# Patient Record
Sex: Male | Born: 1950
Health system: Southern US, Community
[De-identification: ages and names within clinical notes are randomized; demographics above are authoritative.]

## PROBLEM LIST (undated history)

## (undated) DIAGNOSIS — E785 Hyperlipidemia, unspecified: Secondary | ICD-10-CM

## (undated) DIAGNOSIS — I219 Acute myocardial infarction, unspecified: Secondary | ICD-10-CM

## (undated) DIAGNOSIS — M1990 Unspecified osteoarthritis, unspecified site: Secondary | ICD-10-CM

## (undated) DIAGNOSIS — E039 Hypothyroidism, unspecified: Secondary | ICD-10-CM

## (undated) DIAGNOSIS — I10 Essential (primary) hypertension: Secondary | ICD-10-CM

## (undated) DIAGNOSIS — Z9289 Personal history of other medical treatment: Secondary | ICD-10-CM

## (undated) DIAGNOSIS — I2510 Atherosclerotic heart disease of native coronary artery without angina pectoris: Secondary | ICD-10-CM

## (undated) DIAGNOSIS — C61 Malignant neoplasm of prostate: Secondary | ICD-10-CM

## (undated) DIAGNOSIS — R011 Cardiac murmur, unspecified: Secondary | ICD-10-CM

## (undated) DIAGNOSIS — I350 Nonrheumatic aortic (valve) stenosis: Secondary | ICD-10-CM

## (undated) HISTORY — DX: Hypothyroidism, unspecified: E03.9

## (undated) HISTORY — DX: Nonrheumatic aortic (valve) stenosis: I35.0

## (undated) HISTORY — DX: Personal history of other medical treatment: Z92.89

## (undated) HISTORY — DX: Hyperlipidemia, unspecified: E78.5

## (undated) HISTORY — DX: Atherosclerotic heart disease of native coronary artery without angina pectoris: I25.10

## (undated) HISTORY — DX: Essential (primary) hypertension: I10

## (undated) HISTORY — DX: Malignant neoplasm of prostate: C61

---

## 2008-10-13 HISTORY — PX: HERNIA REPAIR: SHX51

## 2009-10-13 DIAGNOSIS — I219 Acute myocardial infarction, unspecified: Secondary | ICD-10-CM

## 2009-10-13 DIAGNOSIS — I2510 Atherosclerotic heart disease of native coronary artery without angina pectoris: Secondary | ICD-10-CM | POA: Insufficient documentation

## 2009-10-13 HISTORY — PX: CARDIAC CATHETERIZATION: SHX172

## 2009-10-13 HISTORY — DX: Atherosclerotic heart disease of native coronary artery without angina pectoris: I25.10

## 2009-10-13 HISTORY — DX: Acute myocardial infarction, unspecified: I21.9

## 2011-10-14 HISTORY — PX: JOINT REPLACEMENT: SHX530

## 2011-10-14 HISTORY — PX: TOTAL HIP ARTHROPLASTY: SHX124

## 2013-10-13 HISTORY — PX: LUMBAR LAMINECTOMY: SHX95

## 2017-04-12 HISTORY — PX: PROSTATE BIOPSY: SHX241

## 2017-05-22 ENCOUNTER — Encounter: Payer: Self-pay | Admitting: Radiation Oncology

## 2017-05-22 ENCOUNTER — Encounter: Payer: Self-pay | Admitting: *Deleted

## 2017-06-01 ENCOUNTER — Ambulatory Visit
Admission: RE | Admit: 2017-06-01 | Discharge: 2017-06-01 | Disposition: A | Discharge status: Home / Self Care | Payer: Medicare Other | Source: Ambulatory Visit | Attending: Radiation Oncology | Admitting: Radiation Oncology

## 2017-06-01 ENCOUNTER — Encounter: Payer: Self-pay | Admitting: Radiation Oncology

## 2017-06-01 ENCOUNTER — Telehealth: Payer: Self-pay | Admitting: Radiation Oncology

## 2017-06-01 VITALS — BP 160/87 | HR 73 | Resp 16 | Ht 67.0 in | Wt 204.6 lb

## 2017-06-01 DIAGNOSIS — C61 Malignant neoplasm of prostate: Secondary | ICD-10-CM

## 2017-06-01 DIAGNOSIS — Z8042 Family history of malignant neoplasm of prostate: Secondary | ICD-10-CM | POA: Diagnosis not present

## 2017-06-01 DIAGNOSIS — Z7982 Long term (current) use of aspirin: Secondary | ICD-10-CM | POA: Diagnosis not present

## 2017-06-01 DIAGNOSIS — F17210 Nicotine dependence, cigarettes, uncomplicated: Secondary | ICD-10-CM | POA: Insufficient documentation

## 2017-06-01 DIAGNOSIS — Z79899 Other long term (current) drug therapy: Secondary | ICD-10-CM | POA: Diagnosis not present

## 2017-06-01 HISTORY — DX: Malignant neoplasm of prostate: C61

## 2017-06-01 NOTE — Progress Notes (Signed)
GU Location of Tumor / Histology: prostatic adenocarcinoma   If Prostate Cancer, Gleason Score is (3 + 4) and PSA is (6.14). Prostate volume: 42.9 cc.  Dr. Alinda Money was recommended to Oscar Bailey by his brother in law. Patient moved here recently from [place]. Patient has a brother who he isn't close with that has a hx of prostate ca. Patient is uncertain of the treatment his brother selected. Patient reports his PSA in November 2012 was 1.9.  Biopsies of prostate (if applicable) revealed:    Past/Anticipated interventions by urology, if any: biopsy, referral to Dr. Tammi Klippel  Past/Anticipated interventions by medical oncology, if any: no  Weight changes, if any: no  Bowel/Bladder complaints, if any: IPSS 4. Reports nocturia x1. Reports mild ED. Denies dysuria, hematuria or leakage.    Nausea/Vomiting, if any: no  Pain issues, if any:  no  SAFETY ISSUES:  Prior radiation? Yes, radioactive iodine 18 years ago.   Pacemaker/ICD? no  Possible current pregnancy? no  Is the patient on methotrexate? no  Current Complaints / other details:  66 year old male. Engaged. Patient mostly interested in proceeding in October with seed implantation.

## 2017-06-01 NOTE — Progress Notes (Signed)
See progress note under physician encounter. 

## 2017-06-01 NOTE — Telephone Encounter (Signed)
Patient has not shown for 1530 appointment. Phoned number listed to inquire. No answer. Left message with contact information requesting a return call.

## 2017-06-01 NOTE — Progress Notes (Signed)
Radiation Oncology         [phone]) [phone] ________________________________  Initial outpatient Consultation  Name: Oscar Bailey MRN: [id number]  Date: 06/01/2017  DOB: 08/28/1951  CC:[name], MD  Raynelle Bring, MD   REFERRING PHYSICIAN: Raynelle Bring, MD  DIAGNOSIS: 66 y.o. gentleman with stage T1c adenocarcinoma of the prostate with a Gleason's score of 3+4 and a PSA of 6.14    ICD-10-CM   1. Malignant neoplasm of prostate ([place]) Oscar Bailey is a 66 y.o. gentleman with a new diagnosis of prostate cancer.  He was noted to have an elevated PSA of 6.14 by his primary care physician, Dr. Inda Castle.  Accordingly, he was referred for evaluation in urology by Dr. Alinda Money and digital rectal examination was performed at that time revealing no nodules.  The patient proceeded to transrectal ultrasound with 12 biopsies of the prostate on 05/08/17.  The prostate volume measured 42.9 cc.  Out of 12 core biopsies, one was positive.  The maximum Gleason score was 3+4, and this was seen in left lateral apex.  The patient reviewed the biopsy results with his urologist and he has kindly been referred today for discussion of potential radiation treatment options.    PREVIOUS RADIATION THERAPY: No Past Medical History:  Past Medical History:  Diagnosis Date  . Prostate cancer [facility])     Past Surgical History: Past Surgical History:  Procedure Laterality Date  . PROSTATE BIOPSY      Social History:  Social History   Social History  . Marital status: Single    Spouse name: N/A  . Number of children: N/A  . Years of education: N/A   Occupational History  . Not on file.   Social History Main Topics  . Smoking status: Current Every Day Smoker    Packs/day: 0.50    Years: 39.00    Types: Cigarettes  . Smokeless tobacco: Never Used  . Alcohol use 6.0 oz/week    10 Cans of beer per week  . Drug use: No  . Sexual activity: Yes   Other Topics  Concern  . Not on file   Social History Narrative  . No narrative on file    Family History: Family History  Problem Relation Age of Onset  . Cancer Brother        prostate   ALLERGIES: Patient has no known allergies.  MEDICATIONS:  Current Outpatient Prescriptions  Medication Sig Dispense Refill  . aspirin EC 81 MG tablet Take 81 mg by mouth daily.    Marland Kitchen atorvastatin (LIPITOR) 10 MG tablet Take 60 mg by mouth daily.     Marland Kitchen levothyroxine (SYNTHROID, LEVOTHROID) 100 MCG tablet Take 100 mcg by mouth daily before breakfast.     . lisinopril (PRINIVIL,ZESTRIL) 5 MG tablet Take 5 mg by mouth daily.    . metoprolol tartrate (LOPRESSOR) 25 MG tablet Take 25 mg by mouth 2 (two) times daily.     No current facility-administered medications for this encounter.     REVIEW OF SYSTEMS: On review of systems, the patient reports that he is doing well overall. He denies any chest pain, shortness of breath, cough, fevers, chills, night sweats, unintended weight changes. He denies any bowel  disturbances, and denies abdominal pain, nausea or vomiting. His IPSS score was 4 indicating mild urinary outflow obstructive symptoms.  He indicated that his erectile function is adequate to complete sexual activity with most attempts. He denies any new  musculoskeletal or joint aches or pains, new skin lesions or concerns. A complete review of systems is obtained and is otherwise negative.    PHYSICAL EXAM:  height is 5\' 7"  (1.702 m) and weight is 204 lb 9.6 oz (92.8 kg). His blood pressure is 160/87 (abnormal) and his pulse is 73. His respiration is 16 and oxygen saturation is 100%.   In general this is a well appearing caucasian male in no acute distress. He is alert and oriented x4 and appropriate throughout the examination. HEENT reveals that the patient is normocephalic, atraumatic. EOMs are intact. PERRLA. Skin is intact without any evidence of gross lesions. Cardiovascular exam reveals a regular rate and  rhythm, no clicks rubs or murmurs are auscultated. Chest is clear to auscultation bilaterally. Lymphatic assessment is performed and does not reveal any adenopathy in the cervical, supraclavicular, axillary, or inguinal chains. Abdomen has active bowel sounds in all quadrants and is intact. The abdomen is soft, non tender, non distended. Lower extremities are negative for pretibial pitting edema, deep calf tenderness, cyanosis or clubbing.  KPS = 100  100 - Normal; no complaints; no evidence of disease. 90   - Able to carry on normal activity; minor signs or symptoms of disease. 80   - Normal activity with effort; some signs or symptoms of disease. 85   - Cares for self; unable to carry on normal activity or to do active work. 60   - Requires occasional assistance, but is able to care for most of his personal needs. 50   - Requires considerable assistance and frequent medical care. 38   - Disabled; requires special care and assistance. 58   - Severely disabled; hospital admission is indicated although death not imminent. 19   - Very sick; hospital admission necessary; active supportive treatment necessary. 10   - Moribund; fatal processes progressing rapidly. 0     - Dead  Karnofsky DA, Abelmann WH, Craver LS and Burchenal JH [phone]) The use of the nitrogen mustards in the palliative treatment of carcinoma: with particular reference to bronchogenic carcinoma Cancer 1 [phone]   LABORATORY DATA:  No results found for: WBC, HGB, HCT, MCV, PLT No results found for: NA, K, CL, CO2 No results found for: ALT, AST, GGT, ALKPHOS, BILITOT   RADIOGRAPHY: No results found.    IMPRESSION/PLAN:  1.  66 y.o. gentleman with stage T1c adenocarcinoma of the prostate with a Gleason's score of 3+4 and a PSA of 6.14.  His T-Stage, Gleason's Score, and PSA put him into the favorable intermediate risk (FIR) group.  Accordingly he is eligible for a variety of potential treatment options including brachytherapy. We  reviewed the the implications of T-stage, Gleason's Score, and PSA on decision-making and outcomes in prostate cancer.  We discussed radiation treatment in the management of prostate cancer with regard to the logistics and delivery of external beam radiation treatment as well as the logistics and delivery of prostate brachytherapy.  We compared and contrasted each of these approaches and also compared these against prostatectomy.  The patient expressed interest in prostate brachytherapy.  We will share this  with Dr. Alinda Money and move forward with scheduling the procedure in October.     I enjoyed meeting with him today, and will look forward to participating in the care of this very nice gentleman.       Carola Rhine, PAC    Tyler Pita, MD  [facility] Direct Dial: [phone]  Fax: [phone] [email or web address]  Skype  LinkedIn

## 2017-06-02 ENCOUNTER — Other Ambulatory Visit: Payer: Self-pay | Admitting: Urology

## 2017-06-02 ENCOUNTER — Telehealth: Payer: Self-pay | Admitting: *Deleted

## 2017-06-02 DIAGNOSIS — C61 Malignant neoplasm of prostate: Secondary | ICD-10-CM

## 2017-06-02 DIAGNOSIS — I519 Heart disease, unspecified: Secondary | ICD-10-CM

## 2017-06-02 NOTE — Telephone Encounter (Signed)
Called patient to ask questions, spoke with patient 

## 2017-06-03 ENCOUNTER — Ambulatory Visit: Payer: Medicare Other | Admitting: Radiation Oncology

## 2017-06-03 ENCOUNTER — Telehealth: Payer: Self-pay | Admitting: *Deleted

## 2017-06-03 ENCOUNTER — Ambulatory Visit: Payer: Medicare Other

## 2017-06-03 NOTE — Telephone Encounter (Signed)
CALLED PATIENT TO INFORM OF APPT. WITH DR. [name] ON 07-13-17 @ 9:45 AM, THIS APPT. IS FOR CARIDAC CLEARANCE, SO HIS IMPLANT CAN BE SCHEDULED, SPOKE WITH PATIENT AND HE IS AWARE OF THIS APPT.

## 2017-07-13 ENCOUNTER — Ambulatory Visit (INDEPENDENT_AMBULATORY_CARE_PROVIDER_SITE_OTHER): Payer: Medicare Other | Admitting: Physician Assistant

## 2017-07-13 ENCOUNTER — Encounter: Payer: Self-pay | Admitting: Physician Assistant

## 2017-07-13 VITALS — BP 120/72 | HR 70 | Ht 67.0 in | Wt 204.1 lb

## 2017-07-13 DIAGNOSIS — E785 Hyperlipidemia, unspecified: Secondary | ICD-10-CM

## 2017-07-13 DIAGNOSIS — Z01810 Encounter for preprocedural cardiovascular examination: Secondary | ICD-10-CM

## 2017-07-13 DIAGNOSIS — I2510 Atherosclerotic heart disease of native coronary artery without angina pectoris: Secondary | ICD-10-CM | POA: Diagnosis not present

## 2017-07-13 DIAGNOSIS — I10 Essential (primary) hypertension: Secondary | ICD-10-CM

## 2017-07-13 DIAGNOSIS — R011 Cardiac murmur, unspecified: Secondary | ICD-10-CM | POA: Diagnosis not present

## 2017-07-13 NOTE — Patient Instructions (Signed)
Medication Instructions:  1. Your physician recommends that you continue on your current medications as directed. Please refer to the Current Medication list given to you today.   Labwork: NONE ORDERED TODAY  Testing/Procedures: 1. Your physician has requested that you have an echocardiogram. Echocardiography is a painless test that uses sound waves to create images of your heart. It provides your doctor with information about the size and shape of your heart and how well your heart's chambers and valves are working. This procedure takes approximately one hour. There are no restrictions for this procedure. 07/14/17 @ 1 PM; PLEASE ARRIVE 15 MINUTES EARLY FOR REGISTRATION    Follow-Up: Your physician wants you to follow-up in: [place] Acie Fredrickson.  You will receive a reminder letter in the mail two months in advance. If you don't receive a letter, please call our office to schedule the follow-up appointment.   Any Other Special Instructions Will Be Listed Below (If Applicable).     If you need a refill on your cardiac medications before your next appointment, please call your pharmacy.

## 2017-07-13 NOTE — Progress Notes (Signed)
Cardiology Office Note:    Date:  07/13/2017   ID:  Oscar Bailey, DOB [birth year], MRN [id number]  PCP:  Oscar Hippo, MD  Cardiologist:  New - Dr. Liam Bailey / Oscar Dopp, PA-C   Referring MD: Oscar Hippo, MD   Chief Complaint  Patient presents with  . Coronary Artery Disease    History of Present Illness:    Oscar Bailey is a 66 y.o. male with a hx of CAD s/p MI in 2011 tx with DES ([place]), HTN, HL, iatrogenic hypothyroidism (s/p RAI), prostate CA who is being seen today for surgical clearance and to establish with Cardiology at the request of Oscar Hippo, MD.   Oscar Bailey is here alone today.  He moved to [place] from [place], [place] in 03/2017.  He has been evaluated by Oscar Bailey for his prostate CA and is now seeing Oscar Bailey for radiation Rx.  He had a routine exercise test prior to leaving [place], [place].  This was low risk without ischemic ECG changes. Oscar Bailey has not had any chest pain or significant shortness of breath.  He has not experienced syncope, paroxysmal nocturnal dyspnea, edema.    PAD Screen 07/13/2017  Previous PAD dx? No  Previous surgical procedure? No  Pain with walking? No  Feet/toe relief with dangling? No  Painful, non-healing ulcers? No  Extremities discolored? No    Prior CV studies:   The following studies were reviewed today:  GXT 12/25/16 [facility], [place], [place]) [place] 6'15", 88% PMHR, 7.4 METs, no chest pain or ECG changes  Past Medical History:  Diagnosis Date  . CAD (coronary artery disease) 2011   Tx with DES ([place], [place]); Oscar Bailey in [place], [place] (hyperlipidemia)   . HTN (hypertension)   . Hypothyroidism    hyperthyroidism >> s/p RAI treatment 2002  . Malignant neoplasm of prostate (Oscar Bailey) 06/01/2017    Past Surgical History:  Procedure Laterality Date  . HERNIA REPAIR  2010   inguinal  . LUMBAR LAMINECTOMY Right 2015  . PROSTATE BIOPSY    . TOTAL  HIP ARTHROPLASTY Right 2013    Current Medications: Current Meds  Medication Sig  . aspirin EC 81 MG tablet Take 81 mg by mouth daily.  Marland Kitchen atorvastatin (LIPITOR) 10 MG tablet TAKE 1.5 TABLET BY MOUTH ( 60 MG TOTAL) DAILY  . levothyroxine (SYNTHROID, LEVOTHROID) 150 MCG tablet Take 150 mcg by mouth every other day.  . levothyroxine (SYNTHROID, LEVOTHROID) 175 MCG tablet Take 175 mcg by mouth every other day.  . lisinopril (PRINIVIL,ZESTRIL) 5 MG tablet Take 5 mg by mouth daily.  . metoprolol tartrate (LOPRESSOR) 25 MG tablet TAKE 0.5 MG TABLET BY MOUTH (25 MG TOTAL) 2( TWO)  TIMES DAILY  . nitroGLYCERIN (NITROSTAT) 0.4 MG SL tablet Place 0.4 mg under the tongue every 5 (five) minutes as needed for chest pain.     Allergies:   Patient has no known allergies.   Social History   Social History  . Marital status: Single    Spouse name: N/A  . Number of children: N/A  . Years of education: N/A   Occupational History  . retired    Social History Main Topics  . Smoking status: Current Every Day Smoker    Packs/day: 0.75    Years: 39.00    Types: Cigarettes  . Smokeless tobacco: Never Used  . Alcohol use 6.0 oz/week    10 Cans  of beer per week  . Drug use: No  . Sexual activity: Yes   Other Topics Concern  . 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   Moved to [place] from [place], [place] in 6.2018   Engaged   1 son -35 yo (Master's Degree candidate in Engineer, mining at [employer])     Family Hx: The patient's family history includes Alzheimer's disease in his mother; CAD in his father; Cancer in his brother; Dementia in his father; Heart attack in his paternal uncle.  ROS:   Please see the history of present illness.    Review of Systems  Constitution: Positive for diaphoresis.  HENT: Positive for hearing loss.   Respiratory: Positive for snoring.    All other systems reviewed and are negative.   EKGs/Labs/Other Test Reviewed:    EKG:   EKG is  ordered today.  The ekg ordered today demonstrates NSR, HR 69, normal axis, septal Q waves, QTc 405 ms, PR interval 158 ms - no old tracing to compare  Recent Labs: No results found for requested labs within last 8760 hours.  Labs performed with primary care 01/2017 in [place], [place]: TC 169, HDL 62, LDL 69.6, triglycerides 187, TSH 1.01, hemoglobin 15, potassium 4.5, creatinine 1.16, ALT 35  Recent Lipid Panel No results found for: CHOL, TRIG, HDL, CHOLHDL, LDLCALC, LDLDIRECT  Physical Exam:    VS:  BP 120/72 (BP Location: Left Arm, Patient Position: Sitting, Cuff Size: Normal)   Pulse 70   Ht 5\' 7"  (1.702 m)   Wt 204 lb 1.9 oz (92.6 kg)   BMI 31.97 kg/m     Wt Readings from Last 3 Encounters:  07/13/17 204 lb 1.9 oz (92.6 kg)  06/01/17 204 lb 9.6 oz (92.8 kg)     Physical Exam  Constitutional: He is oriented to person, place, and time. He appears well-developed and well-nourished. No distress.  HENT:  Head: Normocephalic and atraumatic.  Eyes: No scleral icterus.  Neck: No JVD present.  Cardiovascular: Normal rate and regular rhythm.   Murmur heard.  Harsh crescendo-decrescendo systolic murmur is present with a grade of 3/6  at the upper right sternal border Pulmonary/Chest: Effort normal. He has no rales.  Abdominal: Soft. There is no tenderness.  Musculoskeletal: He exhibits no edema.  Neurological: He is alert and oriented to person, place, and time.  Skin: Skin is warm and dry.  Psychiatric: He has a normal mood and affect.    ASSESSMENT:    1. Coronary artery disease involving native coronary artery of native heart without angina pectoris   2. Essential hypertension   3. Hyperlipidemia, unspecified hyperlipidemia type   4. Murmur   5. Preoperative cardiovascular examination    PLAN:    In order of problems listed above:  1. Coronary artery disease involving native coronary artery of native heart without angina pectoris  History of myocardial  infarction in 2011 treated with a drug-eluting stent at [facility] in [place]. He had a routine exercise treadmill test with his cardiologist prior to leaving [facility] in 3/18. This was low risk without ischemic EKG changes. He is doing well without symptoms of angina. His medical regimen is optimal with aspirin, moderate to high intensity statin therapy, beta blocker and ACE inhibitor. We will request records from his prior cardiologist in [place]. We will arrange routine follow-up in 6 months.  2. Essential hypertension The patient's blood pressure is controlled on his current  regimen.  Continue current therapy.    3. Hyperlipidemia, unspecified hyperlipidemia type LDL optimal on most recent lab work.  Continue current Rx.    4. Murmur Systolic murmur on exam is suggestive of aortic stenosis. He does not complain of any symptoms that would be consistent with severe aortic stenosis. In any event, an echocardiogram will be obtained to further evaluate his murmur.  4. Preoperative cardiovascular examination He needs to undergo radiation seed implantation for treatment of his prostate cancer. According to the Revised cardiac risk index, his risk of major cardiac event in the perioperative period is low at 0.9%. According to the [facility] activity status index, he has an excellent functional capacity and is able to achieve 9.89 METs. Therefore, according to [facility] and AHA guidelines, he does not require further cardiac workup prior to his noncardiac surgery. He may proceed at acceptable risk.     Dispo:  Return in about 6 months (around 01/11/2018) for Routine Follow Up, w/ Dr. Acie Fredrickson.   Medication Adjustments/Labs and Tests Ordered: Current medicines are reviewed at length with the patient today.  Concerns regarding medicines are outlined above.  Orders/Tests:  Orders Placed This Encounter  Procedures  . EKG 12-Lead  . ECHOCARDIOGRAM COMPLETE   Medication changes: No orders  of the defined types were placed in this encounter.  Signed, Oscar Dopp, PA-C  07/13/2017 5:43 PM    [facility] [place], [place], [place]  [address] Phone: [phone]; Fax: [phone]

## 2017-07-14 ENCOUNTER — Other Ambulatory Visit: Payer: Self-pay

## 2017-07-14 ENCOUNTER — Encounter: Payer: Self-pay | Admitting: Physician Assistant

## 2017-07-14 ENCOUNTER — Telehealth: Payer: Self-pay | Admitting: *Deleted

## 2017-07-14 ENCOUNTER — Ambulatory Visit (HOSPITAL_COMMUNITY): Payer: Medicare Other | Attending: Cardiology

## 2017-07-14 DIAGNOSIS — I350 Nonrheumatic aortic (valve) stenosis: Secondary | ICD-10-CM | POA: Insufficient documentation

## 2017-07-14 DIAGNOSIS — R011 Cardiac murmur, unspecified: Secondary | ICD-10-CM | POA: Insufficient documentation

## 2017-07-14 DIAGNOSIS — E785 Hyperlipidemia, unspecified: Secondary | ICD-10-CM | POA: Diagnosis not present

## 2017-07-14 DIAGNOSIS — I10 Essential (primary) hypertension: Secondary | ICD-10-CM | POA: Insufficient documentation

## 2017-07-14 NOTE — Telephone Encounter (Signed)
-----   Message from Liliane Shi, [place] sent at 07/14/2017  5:17 PM EDT ----- Please call the patient. The echocardiogram demonstrates normal ejection fraction, mildly impaired relaxation (diastolic dysfunction) and moderate stiffness of the aortic valve (aortic stenosis). Continue current medications and follow up as planned. Please arrange follow up echocardiogram in 1 year. Please fax a copy of this study result to his PCP:  [name], MD  Thanks! [name], PA-C    07/14/2017 5:15 PM

## 2017-07-14 NOTE — Telephone Encounter (Signed)
Pt has been notified of echo results by phone with verbal understanding to results given today. Pt did ask if our office could please let Dr. Tammi Klippel over at the [facility] know that he is ok have his procedure for prostate cancer. I advised pt I will let [name], PA know and we will let Dr. Tammi Klippel know as well. Pt thanked me for my call today. Results have been forwarded to PCP as well.

## 2017-07-21 ENCOUNTER — Telehealth: Payer: Self-pay | Admitting: *Deleted

## 2017-07-21 NOTE — Telephone Encounter (Signed)
CALLED PATIENT TO INFORM THAT I HAVEN'T RECEIVED CARDIAC CLEARANCE ON HIM, MR. Oscar Bailey WILL CALL HIS CARDIOLOGIST AND HAVE THEM FAX THE NOTE TO ME

## 2017-07-22 ENCOUNTER — Telehealth: Payer: Self-pay | Admitting: *Deleted

## 2017-07-22 NOTE — Telephone Encounter (Signed)
CALLED PATIENT TO INFORM OF PRE-SEED PLANNING CT ON 07-31-17 @ 8 AM, SPOKE WITH PATIENT AND HE IS AWARE OF THIS APPT.

## 2017-07-28 ENCOUNTER — Telehealth: Payer: Self-pay | Admitting: Physician Assistant

## 2017-07-28 NOTE — Progress Notes (Signed)
  Radiation Oncology         [phone]) [phone] ________________________________  Name: Oscar Bailey MRN: [id number]  Date: 07/31/2017  DOB: June 07, 1951  SIMULATION AND TREATMENT PLANNING NOTE PUBIC ARCH STUDY  LF:[id number], Cari Caraway, MD  Raynelle Bring, MD  DIAGNOSIS: 66 y.o. gentleman with stage T1c adenocarcinoma of the prostate with a Gleason's score of 3+4 and a PSA of 6.14     ICD-10-CM   1. Malignant neoplasm of prostate ([place]) C61     COMPLEX SIMULATION:  The patient presented today for evaluation for possible prostate seed implant. He was brought to the radiation planning suite and placed supine on the CT couch. A 3-dimensional image study set was obtained in upload to the planning computer. There, on each axial slice, I contoured the prostate gland. Then, using three-dimensional radiation planning tools I reconstructed the prostate in view of the structures from the transperineal needle pathway to assess for possible pubic arch interference. In doing so, I did not appreciate any pubic arch interference. Also, the patient's prostate volume was estimated based on the drawn structure. The volume was 39 cc.  Given the pubic arch appearance and prostate volume, patient remains a good candidate to proceed with prostate seed implant. Today, he freely provided informed written consent to proceed.    PLAN: The patient will undergo prostate seed implant.   ________________________________  Sheral Apley. Tammi Klippel, M.D.

## 2017-07-28 NOTE — Telephone Encounter (Signed)
Records rec Via Mail From [facility]. Placed in Chart Prep.

## 2017-07-30 ENCOUNTER — Telehealth: Payer: Self-pay | Admitting: *Deleted

## 2017-07-30 NOTE — Telephone Encounter (Signed)
CALLED PATIENT TO REMIND OF  PRE-SEED APPT. FOR 07-31-17, SPOKE WITH PATIENT AND HE IS AWARE OF THIS APPT.

## 2017-07-31 ENCOUNTER — Encounter: Payer: Self-pay | Admitting: Physician Assistant

## 2017-07-31 ENCOUNTER — Encounter: Payer: Self-pay | Admitting: Medical Oncology

## 2017-07-31 ENCOUNTER — Ambulatory Visit
Admission: RE | Admit: 2017-07-31 | Discharge: 2017-07-31 | Disposition: A | Discharge status: Home / Self Care | Payer: Medicare Other | Source: Ambulatory Visit | Attending: Radiation Oncology | Admitting: Radiation Oncology

## 2017-07-31 DIAGNOSIS — C61 Malignant neoplasm of prostate: Secondary | ICD-10-CM | POA: Diagnosis not present

## 2017-08-03 ENCOUNTER — Other Ambulatory Visit: Payer: Self-pay | Admitting: Urology

## 2017-08-03 NOTE — Progress Notes (Signed)
Oscar Bailey here for his pre-seed CT and education. I was unable to meet him when he consulted with Dr. Tammi Klippel so I introduced myself as the nurse navigator and my role. He states that he had to get an echocardiogram for cardiac clearance for the procedure but all is well. After CT today he is a good candidate for seed and the procedure will be scheduled with Dr. Tammi Klippel and Dr. Alinda Money. I asked him to call me with questions or concerns. He voiced understanding.

## 2017-08-04 ENCOUNTER — Ambulatory Visit (HOSPITAL_COMMUNITY)
Admission: RE | Admit: 2017-08-04 | Discharge: 2017-08-04 | Disposition: A | Discharge status: Home / Self Care | Payer: Medicare Other | Source: Ambulatory Visit | Attending: Urology | Admitting: Urology

## 2017-08-04 DIAGNOSIS — J9811 Atelectasis: Secondary | ICD-10-CM | POA: Diagnosis not present

## 2017-08-04 DIAGNOSIS — C61 Malignant neoplasm of prostate: Secondary | ICD-10-CM | POA: Diagnosis not present

## 2017-09-02 ENCOUNTER — Other Ambulatory Visit: Payer: Self-pay | Admitting: Urology

## 2017-09-02 DIAGNOSIS — C61 Malignant neoplasm of prostate: Secondary | ICD-10-CM

## 2017-09-02 NOTE — Progress Notes (Signed)
REVIEWING CHART FOR PRE-OP PHONE INTERVIEW AND NOTED PT ECHO RESULTS DONE 07-14-2017.  PT HAS MODERATE AORTIC STENOSIS W/ VALVE AREA 0.49CM^2 , WHICH IS BELOW AMBULATORY SURGERY ANESTHESIA GUIDELINES.  CALLED AND LM FOR [facility], OR SCHEDULER, THAT PT NEEDS TO BE MOVED TO MAIN OR BECAUSE OF GUIDELINES FOR AORTIC STENOSIS.

## 2017-09-15 ENCOUNTER — Encounter (HOSPITAL_COMMUNITY): Payer: Self-pay

## 2017-09-15 ENCOUNTER — Ambulatory Visit (HOSPITAL_COMMUNITY)
Admission: RE | Admit: 2017-09-15 | Discharge: 2017-09-15 | Disposition: A | Discharge status: Home / Self Care | Payer: Medicare Other | Source: Ambulatory Visit | Attending: Urology | Admitting: Urology

## 2017-09-15 DIAGNOSIS — C61 Malignant neoplasm of prostate: Secondary | ICD-10-CM

## 2017-09-17 ENCOUNTER — Telehealth: Payer: Self-pay | Admitting: *Deleted

## 2017-09-17 NOTE — Telephone Encounter (Signed)
Called patient to remind of labs on 09-22-17 @ 10 am @ [facility], spoke with patient and he is aware of this appt.

## 2017-09-21 NOTE — Patient Instructions (Addendum)
Oscar Bailey  09/21/2017   Your procedure is scheduled on: 09/24/2017    Report to [facility]  Entrance Take [place]  elevators to 3rd floor to  [place] at   10:30 AM.     Call this number if you have problems the morning of surgery [phone]    Remember: ONLY 1 PERSON MAY GO WITH YOU TO SHORT STAY TO GET  READY MORNING OF YOUR SURGERY.    Do not eat food After Midnight. You may have clear liquids from midnight until 7am day of surgery. Nothing by mouth after 7am!    Do Fleets Enema am of surgery prior to arriving at hospital.    Take these medicines the morning of surgery with A SIP OF WATER: Synthroid, Metoprolol, atorvastatin,                                 You may not have any metal on your body including hair pins and              piercings  Do not wear jewelry,  lotions, powders or perfumes, deodorant                          Men may shave face and neck.   Do not bring valuables to the hospital. [place].  Contacts, dentures or bridgework may not be worn into surgery.       Patients discharged the day of surgery will not be allowed to drive home.  Name and phone number of your driver:               Please read over the following fact sheets you were given: _____________________________________________________________________    CLEAR LIQUID DIET   Foods Allowed                                                                     Foods Excluded  Coffee and tea, regular and decaf                             liquids that you cannot  Plain Jell-O in any flavor                                             see through such as: Fruit ices (not with fruit pulp)                                     milk, soups, orange juice  Iced Popsicles  All solid food Carbonated beverages, regular and diet                                    Cranberry, grape  and apple juices Sports drinks like Gatorade Lightly seasoned clear broth or consume(fat free) Sugar, honey syrup  Sample Menu Breakfast                                Lunch                                     Supper Cranberry juice                    Beef broth                            Chicken broth Jell-O                                     Grape juice                           Apple juice Coffee or tea                        Jell-O                                      Popsicle                                                Coffee or tea                        Coffee or tea  _____________________________________________________________________              [facility] - Preparing for Surgery Before surgery, you can play an important role.  Because skin is not sterile, your skin needs to be as free of germs as possible.  You can reduce the number of germs on your skin by washing with CHG (chlorahexidine gluconate) soap before surgery.  CHG is an antiseptic cleaner which kills germs and bonds with the skin to continue killing germs even after washing. Please DO NOT use if you have an allergy to CHG or antibacterial soaps.  If your skin becomes reddened/irritated stop using the CHG and inform your nurse when you arrive at Short Stay. Do not shave (including legs and underarms) for at least 48 hours prior to the first CHG shower.  You may shave your face/neck. Please follow these instructions carefully:  1.  Shower with CHG Soap the night before surgery and the  morning of Surgery.  2.  If you choose to wash your hair, wash your hair first as usual with your  normal  shampoo.  3.  After you shampoo, rinse your hair and body thoroughly to remove the  shampoo.                           4.  Use CHG as you would any other liquid soap.  You can apply chg directly  to the skin and wash                       Gently with a scrungie or clean washcloth.  5.  Apply the CHG Soap to your body ONLY FROM  THE NECK DOWN.   Do not use on face/ open                           Wound or open sores. Avoid contact with eyes, ears mouth and genitals (private parts).                       Wash face,  Genitals (private parts) with your normal soap.             6.  Wash thoroughly, paying special attention to the area where your surgery  will be performed.  7.  Thoroughly rinse your body with warm water from the neck down.  8.  DO NOT shower/wash with your normal soap after using and rinsing off  the CHG Soap.                9.  Pat yourself dry with a clean towel.            10.  Wear clean pajamas.            11.  Place clean sheets on your bed the night of your first shower and do not  sleep with pets. Day of Surgery : Do not apply any lotions/deodorants the morning of surgery.  Please wear clean clothes to the hospital/surgery center.  FAILURE TO FOLLOW THESE INSTRUCTIONS MAY RESULT IN THE CANCELLATION OF YOUR SURGERY PATIENT SIGNATURE_________________________________  NURSE SIGNATURE__________________________________  ________________________________________________________________________

## 2017-09-21 NOTE — Progress Notes (Signed)
LOV- Cardiology PA Scott weaver- 07/13/17-epic  CXR-08/04/17-epic  EKG-07/13/17-epic

## 2017-09-22 ENCOUNTER — Encounter (HOSPITAL_COMMUNITY): Payer: Self-pay

## 2017-09-22 ENCOUNTER — Encounter (HOSPITAL_COMMUNITY)
Admission: RE | Admit: 2017-09-22 | Discharge: 2017-09-22 | Disposition: A | Discharge status: Home / Self Care | Payer: Medicare Other | Source: Ambulatory Visit | Attending: Urology | Admitting: Urology

## 2017-09-22 ENCOUNTER — Ambulatory Visit (HOSPITAL_COMMUNITY)
Admission: RE | Admit: 2017-09-22 | Discharge: 2017-09-22 | Disposition: A | Discharge status: Home / Self Care | Payer: Medicare Other | Source: Ambulatory Visit | Attending: Anesthesiology | Admitting: Anesthesiology

## 2017-09-22 ENCOUNTER — Other Ambulatory Visit: Payer: Self-pay

## 2017-09-22 DIAGNOSIS — C61 Malignant neoplasm of prostate: Secondary | ICD-10-CM | POA: Insufficient documentation

## 2017-09-22 DIAGNOSIS — I10 Essential (primary) hypertension: Secondary | ICD-10-CM | POA: Diagnosis not present

## 2017-09-22 DIAGNOSIS — M5134 Other intervertebral disc degeneration, thoracic region: Secondary | ICD-10-CM | POA: Diagnosis not present

## 2017-09-22 DIAGNOSIS — Z01812 Encounter for preprocedural laboratory examination: Secondary | ICD-10-CM | POA: Diagnosis not present

## 2017-09-22 DIAGNOSIS — Z01811 Encounter for preprocedural respiratory examination: Secondary | ICD-10-CM

## 2017-09-22 DIAGNOSIS — Z01818 Encounter for other preprocedural examination: Secondary | ICD-10-CM | POA: Insufficient documentation

## 2017-09-22 HISTORY — DX: Unspecified osteoarthritis, unspecified site: M19.90

## 2017-09-22 HISTORY — DX: Acute myocardial infarction, unspecified: I21.9

## 2017-09-22 HISTORY — DX: Cardiac murmur, unspecified: R01.1

## 2017-09-22 LAB — CBC
HCT: 45.9 % (ref 39.0–52.0)
Hemoglobin: 15.6 g/dL (ref 13.0–17.0)
MCH: 32.8 pg (ref 26.0–34.0)
MCHC: 34 g/dL (ref 30.0–36.0)
MCV: 96.6 fL (ref 78.0–100.0)
Platelets: 228 10*3/uL (ref 150–400)
RBC: 4.75 MIL/uL (ref 4.22–5.81)
RDW: 12.4 % (ref 11.5–15.5)
WBC: 7.1 10*3/uL (ref 4.0–10.5)

## 2017-09-22 LAB — COMPREHENSIVE METABOLIC PANEL WITH GFR
ALT: 39 U/L (ref 17–63)
AST: 35 U/L (ref 15–41)
Albumin: 4.2 g/dL (ref 3.5–5.0)
Alkaline Phosphatase: 61 U/L (ref 38–126)
Anion gap: 9 (ref 5–15)
BUN: 19 mg/dL (ref 6–20)
CO2: 23 mmol/L (ref 22–32)
Calcium: 9.4 mg/dL (ref 8.9–10.3)
Chloride: 107 mmol/L (ref 101–111)
Creatinine, Ser: 0.95 mg/dL (ref 0.61–1.24)
GFR calc Af Amer: >60 mL/min (ref >60)
GFR calc non Af Amer: >60 mL/min (ref >60)
Glucose, Bld: 94 mg/dL (ref 65–99)
Potassium: 4.1 mmol/L (ref 3.5–5.1)
Sodium: 139 mmol/L (ref 135–145)
Total Bilirubin: 1 mg/dL (ref 0.3–1.2)
Total Protein: 7.2 g/dL (ref 6.5–8.1)

## 2017-09-22 LAB — APTT: aPTT: 31 s (ref 24–36)

## 2017-09-22 LAB — PROTIME-INR
INR: 0.97
Prothrombin Time: 12.8 s (ref 11.4–15.2)

## 2017-09-22 NOTE — Progress Notes (Signed)
LOV / CARDIAC CLEARANCE SCOTT WEAVER PA-C 07-14-07 Epic   [place] 07-14-17 Epic   EKG 07-13-17 Epic  CXR 10-23--18 Epic

## 2017-09-22 NOTE — Progress Notes (Signed)
RN spoke with anesthesia Dr Tobias Alexander to consult regarding patient hx of aortic stenosis. Informed Dr Tobias Alexander of patient cardiac hx of aortic stenosis and clearance from patient's cardiology office per [facility] PA-C. Per Dr Tobias Alexander , patient may proceed as scheduled for surgery ; no recommendations received.

## 2017-09-22 NOTE — Progress Notes (Signed)
   09/22/17 1049  OBSTRUCTIVE SLEEP APNEA  Have you ever been diagnosed with sleep apnea through a sleep study? No  Do you snore loudly (loud enough to be heard through closed doors)?  1  Do you often feel tired, fatigued, or sleepy during the daytime (such as falling asleep during driving or talking to someone)? 0  Has anyone observed you stop breathing during your sleep? 1  Do you have, or are you being treated for high blood pressure? 1  BMI more than 35 kg/m2? 0  Age > 78 (1-yes) 1  Male Gender (Yes=1) 1  Obstructive Sleep Apnea Score 5

## 2017-09-23 ENCOUNTER — Telehealth: Payer: Self-pay | Admitting: *Deleted

## 2017-09-23 NOTE — H&P (Signed)
CC/HPI: CC: Prostate Cancer   PCP: None   Oscar Bailey is a 66 year old gentleman who was noted to have a persistently elevated PSA of 6.14 prompting a TRUS biopsy of the prostate on 05/08/17. This confirmed Gleason 3+4=7 adenocarcinoma in 10% of 1 out of 12 biopsy cores.   Family history: He has a brother with a history of prostate cancer.   Imaging studies: None.   PMH: He has a history of hypothyroidism, hypertension, hyperlipidemia, and coronary artery disease s/p MI and cardiac stent placement.  PSH: Cardiac stent placement (ASA 81 mg), hernia repair, hip replacement.   TNM stage: cT1c Nx Mx  PSA: 6.14  Gleason score: 3+4=7  Biopsy (05/08/17): 1/12 cores positive  Left: L lateral apex (10%, 3+4=7)  Right: Benign  Prostate volume: 42.9 cc   Nomogram  OC disease: 57%  EPE: 42%  SVI: 2%  LNI: 2%  PFS (5 year, 10 year): 89%, 81%   Urinary function: IPSS is 5.  Erectile function: SHIM score is 1.    He follows up today after his recent prostate biopsy. He has recovered well and has no specific complaints today.     ALLERGIES: None   MEDICATIONS: Levothyroxine Sodium  Lisinopril 5 mg tablet  Metoprolol Tartrate 25 mg tablet  Aspirin Ec 81 mg tablet, delayed release  Atorvastatin Calcium     GU PSH: Prostate Needle Biopsy - 05/08/2017    NON-GU PSH: Cardiac Stent Placement Hernia Repair Hip Replacement, Right Lumbar Laminectomy Surgical Pathology, Gross And Microscopic Examination For Prostate Needle - 05/08/2017 Thyroid Surgery    GU PMH: Elevated PSA - 03/27/2017    NON-GU PMH: Arthritis Hypercholesterolemia Hypertension Myocardial Infarction    FAMILY HISTORY: Alzheimer's Disease - Mother Death of family member - Father, Mother Dementia - Father Prostate Cancer - Brother    Notes: 1 son   SOCIAL HISTORY: Marital Status: Single Preferred Language: English; Ethnicity: Not Hispanic Or Latino; Race: White Current Smoking Status: Patient smokes. Has  smoked since 03/13/1978. Smokes 1/2 pack per day.   Tobacco Use Assessment Completed: Used Tobacco in last 30 days? Does not use smokeless tobacco. Drinks 4 drinks per day.  Drinks 3 caffeinated drinks per day. 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    REVIEW OF SYSTEMS:    GU Review Male:   Patient denies frequent urination, hard to postpone urination, burning/ pain with urination, get up at night to urinate, leakage of urine, stream starts and stops, trouble starting your streams, and have to strain to urinate .  Gastrointestinal (Upper):   Patient denies nausea and vomiting.  Gastrointestinal (Lower):   Patient denies diarrhea and constipation.  Constitutional:   Patient denies fever, night sweats, weight loss, and fatigue.  Skin:   Patient denies skin rash/ lesion and itching.  Eyes:   Patient denies blurred vision and double vision.  Ears/ Nose/ Throat:   Patient denies sore throat and sinus problems.  Hematologic/Lymphatic:   Patient denies swollen glands and easy bruising.  Cardiovascular:   Patient denies leg swelling and chest pains.  Respiratory:   Patient denies cough and shortness of breath.  Endocrine:   Patient denies excessive thirst.  Musculoskeletal:   Patient denies back pain and joint pain.  Neurological:   Patient denies dizziness and headaches.  Psychologic:   Patient denies depression and anxiety.     MULTI-SYSTEM PHYSICAL EXAMINATION:    Constitutional: Well-nourished. No physical deformities. Normally developed. Good grooming.    ASSESSMENT:  ICD-10 Details  1 GU:   Prostate Cancer - C61    PLAN:            Notes:   1. Prostate cancer: He has elected to proceed with radiation therapy in the form of brachytherapy.  I discussed the potential benefits and risks of the procedure, side effects of the proposed treatment, the likelihood of the patient achieving the goals of the procedure, and any potential problems that might occur  during the procedure or recuperation. He also will undergo cystoscopy and SpaceOAR insertion.

## 2017-09-23 NOTE — Telephone Encounter (Signed)
Called patient to remind  Of procedure for 09-24-17, spoke with patient and he is aware of this procedure

## 2017-09-24 ENCOUNTER — Encounter (HOSPITAL_COMMUNITY)
Admission: RE | Disposition: A | Discharge status: Home / Self Care | Payer: Self-pay | Source: Ambulatory Visit | Attending: Urology

## 2017-09-24 ENCOUNTER — Ambulatory Visit (HOSPITAL_COMMUNITY): Payer: Medicare Other | Admitting: Anesthesiology

## 2017-09-24 ENCOUNTER — Ambulatory Visit (HOSPITAL_COMMUNITY): Payer: Medicare Other

## 2017-09-24 ENCOUNTER — Ambulatory Visit (HOSPITAL_COMMUNITY)
Admission: RE | Admit: 2017-09-24 | Discharge: 2017-09-24 | Disposition: A | Discharge status: Home / Self Care | Payer: Medicare Other | Source: Ambulatory Visit | Attending: Urology | Admitting: Urology

## 2017-09-24 ENCOUNTER — Encounter (HOSPITAL_COMMUNITY): Payer: Self-pay

## 2017-09-24 DIAGNOSIS — Z79899 Other long term (current) drug therapy: Secondary | ICD-10-CM | POA: Diagnosis not present

## 2017-09-24 DIAGNOSIS — Z8042 Family history of malignant neoplasm of prostate: Secondary | ICD-10-CM | POA: Insufficient documentation

## 2017-09-24 DIAGNOSIS — Z955 Presence of coronary angioplasty implant and graft: Secondary | ICD-10-CM | POA: Insufficient documentation

## 2017-09-24 DIAGNOSIS — I252 Old myocardial infarction: Secondary | ICD-10-CM | POA: Insufficient documentation

## 2017-09-24 DIAGNOSIS — I2510 Atherosclerotic heart disease of native coronary artery without angina pectoris: Secondary | ICD-10-CM | POA: Insufficient documentation

## 2017-09-24 DIAGNOSIS — E039 Hypothyroidism, unspecified: Secondary | ICD-10-CM | POA: Insufficient documentation

## 2017-09-24 DIAGNOSIS — Z79890 Hormone replacement therapy (postmenopausal): Secondary | ICD-10-CM | POA: Insufficient documentation

## 2017-09-24 DIAGNOSIS — Z7982 Long term (current) use of aspirin: Secondary | ICD-10-CM | POA: Insufficient documentation

## 2017-09-24 DIAGNOSIS — E7800 Pure hypercholesterolemia, unspecified: Secondary | ICD-10-CM | POA: Insufficient documentation

## 2017-09-24 DIAGNOSIS — Z96641 Presence of right artificial hip joint: Secondary | ICD-10-CM | POA: Insufficient documentation

## 2017-09-24 DIAGNOSIS — I10 Essential (primary) hypertension: Secondary | ICD-10-CM | POA: Diagnosis not present

## 2017-09-24 DIAGNOSIS — C61 Malignant neoplasm of prostate: Secondary | ICD-10-CM | POA: Insufficient documentation

## 2017-09-24 DIAGNOSIS — E785 Hyperlipidemia, unspecified: Secondary | ICD-10-CM | POA: Diagnosis not present

## 2017-09-24 HISTORY — PX: RADIOACTIVE SEED IMPLANT: SHX5150

## 2017-09-24 HISTORY — PX: CYSTOSCOPY: SHX5120

## 2017-09-24 HISTORY — PX: SPACE OAR INSTILLATION: SHX6769

## 2017-09-24 SURGERY — INSERTION, RADIATION SOURCE, PROSTATE
Anesthesia: General

## 2017-09-24 MED ORDER — DOCUSATE SODIUM 100 MG PO CAPS: 100.0000 mg | ORAL_CAPSULE | Freq: Two times a day (BID) | ORAL | 0 refills | Status: DC

## 2017-09-24 MED ORDER — MEPERIDINE HCL 50 MG/ML IJ SOLN: 6.2500 mg | INTRAMUSCULAR | Status: DC | PRN

## 2017-09-24 MED ORDER — ONDANSETRON HCL 4 MG/2ML IJ SOLN
INTRAMUSCULAR | Status: DC | PRN
  Administered 2017-09-24: 4 mg via INTRAVENOUS

## 2017-09-24 MED ORDER — PHENYLEPHRINE HCL 10 MG/ML IJ SOLN
INTRAMUSCULAR | Status: AC
  Filled 2017-09-24: qty 1

## 2017-09-24 MED ORDER — TAMSULOSIN HCL 0.4 MG PO CAPS: 0.4000 mg | ORAL_CAPSULE | Freq: Every day | ORAL | 0 refills | Status: DC

## 2017-09-24 MED ORDER — FENTANYL CITRATE (PF) 100 MCG/2ML IJ SOLN
INTRAMUSCULAR | Status: AC
  Filled 2017-09-24: qty 2

## 2017-09-24 MED ORDER — LACTATED RINGERS IV SOLN
INTRAVENOUS | Status: DC
  Administered 2017-09-24 (×2): via INTRAVENOUS

## 2017-09-24 MED ORDER — HYDROCODONE-ACETAMINOPHEN 5-325 MG PO TABS: 1.0000 | ORAL_TABLET | Freq: Four times a day (QID) | ORAL | 0 refills | Status: DC | PRN

## 2017-09-24 MED ORDER — ONDANSETRON HCL 4 MG/2ML IJ SOLN
INTRAMUSCULAR | Status: AC
  Filled 2017-09-24: qty 2

## 2017-09-24 MED ORDER — MIDAZOLAM HCL 5 MG/5ML IJ SOLN
INTRAMUSCULAR | Status: DC | PRN
  Administered 2017-09-24: 2 mg via INTRAVENOUS

## 2017-09-24 MED ORDER — MIDAZOLAM HCL 2 MG/2ML IJ SOLN: 0.5000 mg | Freq: Once | INTRAMUSCULAR | Status: DC | PRN

## 2017-09-24 MED ORDER — PROPOFOL 10 MG/ML IV BOLUS
INTRAVENOUS | Status: AC
  Filled 2017-09-24: qty 20

## 2017-09-24 MED ORDER — STERILE WATER FOR INJECTION IJ SOLN
INTRAMUSCULAR | Status: AC
  Filled 2017-09-24: qty 10

## 2017-09-24 MED ORDER — DEXAMETHASONE SODIUM PHOSPHATE 10 MG/ML IJ SOLN
INTRAMUSCULAR | Status: AC
  Filled 2017-09-24: qty 1

## 2017-09-24 MED ORDER — HYDROCODONE-ACETAMINOPHEN 5-325 MG PO TABS
1.0000 | ORAL_TABLET | Freq: Four times a day (QID) | ORAL | Status: DC | PRN
Start: 2017-09-24 — End: 2017-09-24

## 2017-09-24 MED ORDER — FENTANYL CITRATE (PF) 100 MCG/2ML IJ SOLN
INTRAMUSCULAR | Status: DC | PRN
  Administered 2017-09-24: 50 ug via INTRAVENOUS
  Administered 2017-09-24 (×5): 25 ug via INTRAVENOUS

## 2017-09-24 MED ORDER — SODIUM CHLORIDE 0.9 % IJ SOLN
INTRAMUSCULAR | Status: AC
  Filled 2017-09-24: qty 10

## 2017-09-24 MED ORDER — LIDOCAINE 2% (20 MG/ML) 5 ML SYRINGE
INTRAMUSCULAR | Status: AC
  Filled 2017-09-24: qty 5

## 2017-09-24 MED ORDER — PHENYLEPHRINE HCL 10 MG/ML IJ SOLN
INTRAVENOUS | Status: DC | PRN
  Administered 2017-09-24: 50 ug/min via INTRAVENOUS

## 2017-09-24 MED ORDER — DEXAMETHASONE SODIUM PHOSPHATE 10 MG/ML IJ SOLN
INTRAMUSCULAR | Status: DC | PRN
  Administered 2017-09-24: 10 mg via INTRAVENOUS

## 2017-09-24 MED ORDER — FLEET ENEMA 7-19 GM/118ML RE ENEM: 1.0000 | ENEMA | Freq: Once | RECTAL | Status: DC

## 2017-09-24 MED ORDER — MIDAZOLAM HCL 2 MG/2ML IJ SOLN
INTRAMUSCULAR | Status: AC
  Filled 2017-09-24: qty 2

## 2017-09-24 MED ORDER — LIDOCAINE 2% (20 MG/ML) 5 ML SYRINGE
INTRAMUSCULAR | Status: DC | PRN
  Administered 2017-09-24: 100 mg via INTRAVENOUS

## 2017-09-24 MED ORDER — CIPROFLOXACIN IN D5W 400 MG/200ML IV SOLN
400.0000 mg | INTRAVENOUS | Status: AC
  Administered 2017-09-24: 400 mg via INTRAVENOUS
  Filled 2017-09-24: qty 200

## 2017-09-24 MED ORDER — LIDOCAINE HCL 2 % EX GEL
CUTANEOUS | Status: AC
  Filled 2017-09-24: qty 5

## 2017-09-24 MED ORDER — PROPOFOL 10 MG/ML IV BOLUS
INTRAVENOUS | Status: DC | PRN
Start: 2017-09-24 — End: 2017-09-24
  Administered 2017-09-24: 150 mg via INTRAVENOUS
  Administered 2017-09-24: 20 mg via INTRAVENOUS

## 2017-09-24 MED ORDER — PROMETHAZINE HCL 25 MG/ML IJ SOLN: 6.2500 mg | INTRAMUSCULAR | Status: DC | PRN

## 2017-09-24 MED ORDER — STERILE WATER FOR IRRIGATION IR SOLN
Status: DC | PRN
  Administered 2017-09-24: 3000 mL via INTRAVESICAL

## 2017-09-24 MED ORDER — CIPROFLOXACIN HCL 250 MG PO TABS: 250.0000 mg | ORAL_TABLET | Freq: Two times a day (BID) | ORAL | 0 refills | Status: DC

## 2017-09-24 SURGICAL SUPPLY — 27 items
BAG URINE DRAINAGE (UROLOGICAL SUPPLIES) ×2 IMPLANT
BAG URO CATCHER STRL LF (MISCELLANEOUS) ×2 IMPLANT
CATH FOLEY 2WAY SLVR  5CC 16FR (CATHETERS) ×1
CATH FOLEY 2WAY SLVR 5CC 16FR (CATHETERS) ×1 IMPLANT
CATH INTERMIT  6FR 70CM (CATHETERS) ×2 IMPLANT
CATH ROBINSON RED A/P 20FR (CATHETERS) ×2 IMPLANT
CLOTH BEACON ORANGE TIMEOUT ST (SAFETY) ×2 IMPLANT
COVER BACK TABLE 60X90IN (DRAPES) ×2 IMPLANT
COVER FOOTSWITCH UNIV (MISCELLANEOUS) IMPLANT
COVER MAYO STAND STRL (DRAPES) ×2 IMPLANT
COVER SURGICAL LIGHT HANDLE (MISCELLANEOUS) IMPLANT
DRSG TEGADERM 4X4.75 (GAUZE/BANDAGES/DRESSINGS) ×2 IMPLANT
DRSG TEGADERM 8X12 (GAUZE/BANDAGES/DRESSINGS) ×2 IMPLANT
GAUZE SPONGE 4X4 12PLY STRL (GAUZE/BANDAGES/DRESSINGS) ×2 IMPLANT
GLOVE BIOGEL M STRL SZ7.5 (GLOVE) ×6 IMPLANT
GOWN STRL REUS W/TWL LRG LVL3 (GOWN DISPOSABLE) ×4 IMPLANT
GUIDEWIRE STR DUAL SENSOR (WIRE) IMPLANT
HOLDER FOLEY CATH W/STRAP (MISCELLANEOUS) IMPLANT
MANIFOLD NEPTUNE II (INSTRUMENTS) ×2 IMPLANT
MARKER GOLD PRELOAD 1.2X3 (Urological Implant) ×1 IMPLANT
PACK CYSTO (CUSTOM PROCEDURE TRAY) ×2 IMPLANT
SEED GOLD PRELOAD 1.2X3 (Urological Implant) ×2 IMPLANT
SUT BONE WAX W31G (SUTURE) IMPLANT
SYR 10ML LL (SYRINGE) ×2 IMPLANT
TOWEL OR 17X26 10 PK STRL BLUE (TOWEL DISPOSABLE) ×2 IMPLANT
TUBING CONNECTING 10 (TUBING) ×2 IMPLANT
WATER STERILE IRR 1000ML POUR (IV SOLUTION) ×2 IMPLANT

## 2017-09-24 NOTE — Anesthesia Preprocedure Evaluation (Addendum)
Anesthesia Evaluation  Patient identified by MRN, date of birth, ID band Patient awake    Reviewed: Allergy & Precautions, NPO status , Patient's Chart, lab work & pertinent test results  History of Anesthesia Complications Negative for: history of anesthetic complications  Airway Mallampati: II  TM Distance: >3 FB Neck ROM: Full    Dental  (+) Poor Dentition, Dental Advisory Given   Pulmonary neg pulmonary ROS, Current Smoker,    breath sounds clear to auscultation       Cardiovascular hypertension, Pt. on medications and Pt. on home beta blockers (-) angina+ CAD, + Past MI and + Cardiac Stents  + Valvular Problems/Murmurs AS  Rhythm:Regular Rate:Normal  Echo 10/18: EF 55-60, normal wall motion, grade 1 diastolic dysfunction, moderate aortic stenosis (mean 20, peak 35)   Neuro/Psych negative neurological ROS     GI/Hepatic negative GI ROS, Neg liver ROS,   Endo/Other  Hypothyroidism Morbid obesity  Renal/GU negative Renal ROS   Prostate cancer    Musculoskeletal  (+) Arthritis , Osteoarthritis,    Abdominal (+) + obese,   Peds  Hematology   Anesthesia Other Findings   Reproductive/Obstetrics                            Anesthesia Physical Anesthesia Plan  ASA: III  Anesthesia Plan: General   Post-op Pain Management:    Induction: Intravenous  PONV Risk Score and Plan: 1 and Ondansetron and Dexamethasone  Airway Management Planned: LMA  Additional Equipment:   Intra-op Plan:   Post-operative Plan:   Informed Consent: I have reviewed the patients History and Physical, chart, labs and discussed the procedure including the risks, benefits and alternatives for the proposed anesthesia with the patient or authorized representative who has indicated his/her understanding and acceptance.   Dental advisory given  Plan Discussed with: CRNA and Surgeon  Anesthesia Plan Comments:  (Plan routine monitors, GETA)        Anesthesia Quick Evaluation

## 2017-09-24 NOTE — Anesthesia Procedure Notes (Signed)
Procedure Name: LMA Insertion Date/Time: 09/24/2017 1:33 PM Performed by: Lind Covert, CRNA Pre-anesthesia Checklist: Patient identified, Emergency Drugs available, Suction available and Patient being monitored Patient Re-evaluated:Patient Re-evaluated prior to induction Oxygen Delivery Method: Circle system utilized Preoxygenation: Pre-oxygenation with 100% oxygen Induction Type: IV induction Ventilation: Mask ventilation without difficulty LMA: LMA inserted LMA Size: 4.0 Number of attempts: 1 Placement Confirmation: positive ETCO2 and breath sounds checked- equal and bilateral Tube secured with: Tape Dental Injury: Teeth and Oropharynx as per pre-operative assessment

## 2017-09-24 NOTE — Op Note (Signed)
Preoperative diagnosis: Clinically localized adenocarcinoma of the prostate (T1c Nx Mx)  Postoperative diagnosis: Clinically localized adenocarcinoma of the prostate (T1c Nx Mx)  Procedure: 1) Transperineal placement of radioactive seeds into the prostate                    2) Cystoscopy                    3) Insertion of SpaceOAR hydrogel   Surgeon: Pryor Curia. M.D.  Radiation oncologist: Dr. Ledon Snare  Anesthesia: General  EBL: Minimal  Complications: None  Indication: Oscar Bailey is a 66 y.o. gentleman with clinically localized prostate cancer. After discussing management options for treatment, he elected to proceed with radiotherapy. He presents today for the above procedures. The potential risks, complications, alternative options, and expected recovery course have been discussed in detail with the patient and he has provided informed consent to proceed.  Description of procedure: The patient was taken to the operating room and general anesthesia was induced. He was administered preoperative antibiotics, placed in the dorsal lithotomy position, and prepped and draped in the usual sterile fashion. Next, intraoperative transrectal ultrasonography was utilized for real-time intraoperative planning by the radiation oncology team. Once the treatment plan was completed, radiation seeds were placed utilizing a brachytherapy perineal template and the robotic Nucletron was utilized to place 75 radioactive iodine 125 seeds into the prostate through 25 catheter needles.  The brachytherapy template was then removed.  A site in the midline was selected on the perineum for placement of an 18 g needle with saline.  The needle was advanced above the rectum and below Denonvillier's fascia to the mid gland and confirmed to be in the midline on transverse imaging.  One cc of saline was injected confirming appropriate expansion of this space.  A total of 5 cc of saline was then injected to open  the space further bilaterally.  The saline syringe was then removed and the SpaceOAR hydrogel was injected with good distribution bilaterally. Position of the radiation seeds was confirmed on fluoroscopic imaging.  Flexible cystoscopy was then performed and no seeds were identified within the bladder.  No bladder tumors, stones, or other mucosal pathology was identified within the bladder. He tolerated the procedure well and without complications. He was able to be transferred to the recovery unit in satisfactory condition.  He was given a voiding trial in the PACU.

## 2017-09-24 NOTE — Anesthesia Postprocedure Evaluation (Signed)
Anesthesia Post Note  Patient: Oscar Bailey  Procedure(s) Performed: RADIOACTIVE SEED IMPLANT/BRACHYTHERAPY IMPLANT (N/A ) SPACE OAR INSTILLATION (N/A ) CYSTOSCOPY (N/A )     Patient location during evaluation: PACU Anesthesia Type: General Level of consciousness: awake and alert, oriented and patient cooperative Pain management: pain level controlled Vital Signs Assessment: post-procedure vital signs reviewed and stable Respiratory status: spontaneous breathing, nonlabored ventilation and respiratory function stable Cardiovascular status: blood pressure returned to baseline and stable Postop Assessment: no apparent nausea or vomiting Anesthetic complications: no    Last Vitals:  Vitals:   09/24/17 1530 09/24/17 1545  BP: 118/77 126/81  Pulse: 71 67  Resp: 17 16  Temp:  (!) 36.3 C  SpO2: 96% 96%    Last Pain:  Vitals:   09/24/17 1520  TempSrc:   PainSc: 0-No pain                 JACKSON,E. CARSWELL

## 2017-09-24 NOTE — Discharge Instructions (Addendum)
° °

## 2017-09-24 NOTE — Transfer of Care (Signed)
Immediate Anesthesia Transfer of Care Note  Patient: Oscar Bailey  Procedure(s) Performed: RADIOACTIVE SEED IMPLANT/BRACHYTHERAPY IMPLANT (N/A ) SPACE OAR INSTILLATION (N/A ) CYSTOSCOPY (N/A )  Patient Location: PACU  Anesthesia Type:General  Level of Consciousness: awake, alert  and oriented  Airway & Oxygen Therapy: Patient Spontanous Breathing and Patient connected to face mask oxygen  Post-op Assessment: Report given to RN and Post -op Vital signs reviewed and stable  Post vital signs: Reviewed and stable  Last Vitals:  Vitals:   09/24/17 1034  BP: (!) 154/107  Pulse: 77  Resp: 18  Temp: 36.6 C  SpO2: 97%    Last Pain:  Vitals:   09/24/17 1034  TempSrc: Oral      Patients Stated Pain Goal: 4 (92/44/62 [id number])  Complications: No apparent anesthesia complications

## 2017-09-25 ENCOUNTER — Encounter (HOSPITAL_COMMUNITY): Payer: Self-pay | Admitting: Urology

## 2017-09-25 NOTE — Progress Notes (Signed)
  Radiation Oncology         [phone] ________________________________  Name: Oscar Bailey MRN: [id number]  Date: 09/25/2017  DOB: 01/09/1951       Prostate Seed Implant  CC:[name], MD  No ref. provider found  DIAGNOSIS: 66 y.o. gentleman with stage T1c adenocarcinoma of the prostate with a Gleason's score of 3+4 and a PSA of 6.14    ICD-10-CM   1. Prostate cancer ([place]) C61 DG Chest 2 View    DG Chest 2 View    PROCEDURE: Insertion of radioactive I-125 seeds into the prostate gland.  RADIATION DOSE: 145 Gy, definitive therapy.  TECHNIQUE: [name] was brought to the operating room with the urologist. He was placed in the dorsolithotomy position. He was catheterized and a rectal tube was inserted. The perineum was shaved, prepped and draped. The ultrasound probe was then introduced into the rectum to see the prostate gland.  TREATMENT DEVICE: A needle grid was attached to the ultrasound probe stand and anchor needles were placed.  3D PLANNING: The prostate was imaged in 3D using a sagittal sweep of the prostate probe. These images were transferred to the planning computer. There, the prostate, urethra and rectum were defined on each axial reconstructed image. Then, the software created an optimized 3D plan and a few seed positions were adjusted. The quality of the plan was reviewed using [facility] information for the target and the following two organs at risk:  Urethra and Rectum.  Then the accepted plan was uploaded to the seed Selectron afterloading unit.  PROSTATE VOLUME STUDY:  Using transrectal ultrasound the volume of the prostate was verified to be 40.78 cc.  SPECIAL TREATMENT PROCEDURE/SUPERVISION AND HANDLING: The Nucletron FIRST system was used to place the needles under sagittal guidance. A total of 20 needles were used to deposit 75 seeds in the prostate gland. The individual seed activity was 0.393 mCi.  SpaceOAR:  Yes  COMPLEX SIMULATION: At the end of the  procedure, an anterior radiograph of the pelvis was obtained to document seed positioning and count. Cystoscopy was performed to check the urethra and bladder.  MICRODOSIMETRY: At the end of the procedure, the patient was emitting 0.123 mR/hr at 1 meter. Accordingly, he was considered safe for hospital discharge.  PLAN: The patient will return to the radiation oncology clinic for post implant CT dosimetry in three weeks.   ________________________________  Sheral Apley Tammi Klippel, M.D.

## 2017-10-12 ENCOUNTER — Other Ambulatory Visit: Payer: Self-pay | Admitting: Radiation Oncology

## 2017-10-12 ENCOUNTER — Telehealth: Payer: Self-pay | Admitting: Radiation Oncology

## 2017-10-12 MED ORDER — TAMSULOSIN HCL 0.4 MG PO CAPS: 0.4000 mg | ORAL_CAPSULE | Freq: Two times a day (BID) | ORAL | 5 refills | Status: DC

## 2017-10-12 NOTE — Telephone Encounter (Signed)
Per Dr. Johny Shears order called in flomax to [facility] on [address].

## 2017-10-12 NOTE — Progress Notes (Signed)
Called patient after got message about difficulty going to bathroom.  I advised he increase Flomax to BID and use colace.  Will follow-up later this week.

## 2017-10-14 ENCOUNTER — Telehealth: Payer: Self-pay | Admitting: *Deleted

## 2017-10-14 NOTE — Telephone Encounter (Signed)
Called patient to remind of appts. for 10-15-17, spoke with patient and he is aware of this appt.

## 2017-10-15 ENCOUNTER — Ambulatory Visit (HOSPITAL_COMMUNITY)
Admission: RE | Admit: 2017-10-15 | Discharge: 2017-10-15 | Disposition: A | Discharge status: Home / Self Care | Payer: Medicare Other | Source: Ambulatory Visit | Attending: Urology | Admitting: Urology

## 2017-10-15 ENCOUNTER — Encounter: Payer: Self-pay | Admitting: Medical Oncology

## 2017-10-15 ENCOUNTER — Ambulatory Visit: Payer: Medicare Other | Admitting: Radiation Oncology

## 2017-10-15 ENCOUNTER — Ambulatory Visit
Admission: RE | Admit: 2017-10-15 | Discharge: 2017-10-15 | Disposition: A | Discharge status: Home / Self Care | Payer: Medicare Other | Source: Ambulatory Visit | Attending: Radiation Oncology | Admitting: Radiation Oncology

## 2017-10-15 ENCOUNTER — Encounter: Payer: Self-pay | Admitting: Radiation Oncology

## 2017-10-15 ENCOUNTER — Other Ambulatory Visit: Payer: Self-pay

## 2017-10-15 VITALS — BP 143/87 | HR 74 | Temp 97.9°F | Resp 16 | Wt 207.0 lb

## 2017-10-15 DIAGNOSIS — Y842 Radiological procedure and radiotherapy as the cause of abnormal reaction of the patient, or of later complication, without mention of misadventure at the time of the procedure: Secondary | ICD-10-CM | POA: Diagnosis not present

## 2017-10-15 DIAGNOSIS — C61 Malignant neoplasm of prostate: Secondary | ICD-10-CM | POA: Diagnosis not present

## 2017-10-15 DIAGNOSIS — Z510 Encounter for antineoplastic radiation therapy: Secondary | ICD-10-CM | POA: Diagnosis not present

## 2017-10-15 DIAGNOSIS — Z79899 Other long term (current) drug therapy: Secondary | ICD-10-CM | POA: Diagnosis not present

## 2017-10-15 DIAGNOSIS — Z7982 Long term (current) use of aspirin: Secondary | ICD-10-CM | POA: Insufficient documentation

## 2017-10-15 NOTE — Progress Notes (Signed)
  Radiation Oncology         [phone] ________________________________  Name: Oscar Bailey MRN: [id number]  Date: 10/15/2017  DOB: May 10, 1951  COMPLEX SIMULATION NOTE  NARRATIVE:  The patient was brought to the [facility] suite today following prostate seed implantation approximately one month ago.  Identity was confirmed.  All relevant records and images related to the planned course of therapy were reviewed.  Then, the patient was set-up supine.  CT images were obtained.  The CT images were loaded into the planning software.  Then the prostate and rectum were contoured.  Treatment planning then occurred.  The implanted iodine 125 seeds were identified by the physics staff for projection of radiation distribution  I have requested : 3D Simulation  I have requested a DVH of the following structures: Prostate and rectum.    ________________________________  Sheral Apley Tammi Klippel, M.D.  This document serves as a record of services personally performed by Tyler Pita, MD. It was created on his behalf by Bethann Humble, a trained medical scribe. The creation of this record is based on the scribe's personal observations and the provider's statements to them. This document has been checked and approved by the attending provider.

## 2017-10-15 NOTE — Progress Notes (Signed)
Oscar Bailey states the seed implant went well but lots of urinary symptoms. He is taking Flomax and this has really helped. He notes improvement daily. He is aware that it will continue to improve over the next 3-4 months. He will follow up with Dr. Alinda Money 10/31/17.

## 2017-10-15 NOTE — Progress Notes (Signed)
Patient returns today for post seed follow up. Pre seed IPSS was 4. Post seed IPSS is 31. Patient reports great improvement of urinary symptoms with Flomax bid. Patient reports moderate dysuria. Patient states,"I get a wicked urge to urination" followed by hesitancy, little output and difficulty emptying his bladder completely. Reports occasional leakage. Denies hematuria. Reports taking a stool softener bid. Denies diarrhea. Scheduled for MRI to at 1200 to confirm spaceoar placement. Scheduled to follow up with urologist on 10-21-17.  BP (!) 143/87 (BP Location: Right Arm, Patient Position: Sitting, Cuff Size: Normal)   Pulse 74   Temp 97.9 F (36.6 C) (Oral)   Resp 16   Wt 207 lb (93.9 kg)   SpO2 99%   BMI 32.42 kg/m  Wt Readings from Last 3 Encounters:  10/15/17 207 lb (93.9 kg)  09/24/17 210 lb (95.3 kg)  09/22/17 210 lb (95.3 kg)

## 2017-10-15 NOTE — Progress Notes (Signed)
Radiation Oncology         [phone]) [phone] ________________________________  Name: ALDOUS HOUSEL MRN: [id number]  Date: 10/15/2017  DOB: [birth year]  Follow-Up Visit Note  CC: Oscar Hippo, MD  Raynelle Bring, MD  Diagnosis: 67 y.o. gentleman with stage T1c adenocarcinoma of the prostate with a Gleason's score of 3+4 and a PSA of 6.14    ICD-10-CM   1. Malignant neoplasm of prostate ([place]) C61    Interval Since Last Radiation: 3 weeks; 09/24/17  Narrative:  The patient returns today for routine follow-up.  He is complaining of increased urinary frequency and urinary hesitation symptoms. He filled out a questionnaire regarding urinary function today providing and overall IPSS score of 31 characterizing his symptoms as severe.  His pre-implant score was 4. He denies any bowel symptoms.  Patient reports great improvement of urinary symptoms with Flomax bid. Patient reports moderate dysuria. He notes "a wicked urge to urinate" followed by hesitancy, little output, and difficulty emptying his bladder completely. He reports occasional leakage. He denies hematuria. He reports taking a stool softener bid. He denies diarrhea.   ALLERGIES:  has No Known Allergies.  Meds: Current Outpatient Medications  Medication Sig Dispense Refill  . aspirin EC 81 MG tablet Take 81 mg by mouth daily.    Marland Kitchen atorvastatin (LIPITOR) 40 MG tablet Take 60 mg by mouth daily. Takes 1.5 tablets    . docusate sodium (COLACE) 100 MG capsule Take 1 capsule (100 mg total) by mouth 2 (two) times daily. 30 capsule 0  . levothyroxine (SYNTHROID, LEVOTHROID) 150 MCG tablet Take 150 mcg by mouth every other day. Alternates and takes 150 mcg 1 day and 175 mcg the next day    . levothyroxine (SYNTHROID, LEVOTHROID) 175 MCG tablet Take 175 mcg by mouth every other day. Alternates and takes 150 mcg 1 day and 175 mcg the next day    . lisinopril (PRINIVIL,ZESTRIL) 5 MG tablet Take 5 mg by mouth daily.    . metoprolol tartrate  (LOPRESSOR) 25 MG tablet Take 12.5 mg by mouth 2 (two) times daily.     . nitroGLYCERIN (NITROSTAT) 0.4 MG SL tablet Place 0.4 mg under the tongue every 5 (five) minutes as needed for chest pain.    . tamsulosin (FLOMAX) 0.4 MG CAPS capsule Take 1 capsule (0.4 mg total) by mouth 2 (two) times daily. 60 capsule 5   No current facility-administered medications for this encounter.     Physical Findings: The patient is in no acute distress. Patient is alert and oriented.  weight is 207 lb (93.9 kg). His oral temperature is 97.9 F (36.6 C). His blood pressure is 143/87 (abnormal) and his pulse is 74. His respiration is 16 and oxygen saturation is 99%. .  No significant changes. In general this is a well appearing caucasian gentleman in no acute distress. He's alert and oriented x4 and appropriate throughout the examination. Cardiopulmonary assessment is negative for acute distress and he exhibits normal effort.    Lab Findings: Lab Results  Component Value Date   WBC 7.1 09/22/2017   HGB 15.6 09/22/2017   HCT 45.9 09/22/2017   MCV 96.6 09/22/2017   PLT 228 09/22/2017    Radiographic Findings:  Patient underwent CT imaging in our clinic for post implant dosimetry. The CT appears to demonstrate an adequate distribution of radioactive seeds throughout the prostate gland. There no seeds in her near the rectum. I suspect the final radiation plan and dosimetry will show appropriate coverage of  the prostate gland.   Impression: The patient is recovering from the effects of radiation. His urinary symptoms should gradually improve over the next 4-6 months. We talked about this today. He is encouraged by his improvement already and is otherwise pleased with his outcome.   Plan: Today, I spent time talking to the patient about his prostate seed implant and resolving urinary symptoms. We also talked about long-term follow-up for prostate cancer following seed implant. He understands that ongoing PSA  determinations and digital rectal exams will help perform surveillance to rule out disease recurrence. He understands what to expect with his PSA measures. Patient was also educated today about some of the long-term effects from radiation including a small risk for rectal bleeding and possibly erectile dysfunction. We talked about some of the general management approaches to these potential complications. However, I did encourage the patient to contact our office or return at any point if he has questions or concerns related to his previous radiation and prostate cancer.  _____________________________________  Sheral Apley. Tammi Klippel, M.D.   This document serves as a record of services personally performed by Tyler Pita, MD. It was created on his behalf by Bethann Humble, a trained medical scribe. The creation of this record is based on the scribe's personal observations and the provider's statements to them. This document has been checked and approved by the attending provider.

## 2017-10-28 ENCOUNTER — Encounter: Payer: Self-pay | Admitting: Radiation Oncology

## 2017-10-28 DIAGNOSIS — Z510 Encounter for antineoplastic radiation therapy: Secondary | ICD-10-CM | POA: Diagnosis not present

## 2017-11-29 NOTE — Progress Notes (Signed)
  Radiation Oncology         [phone]) [phone] ________________________________  Name: Oscar Bailey MRN: [id number]  Date: 10/28/2017  DOB: [birth year]  3D Planning Note   Prostate Brachytherapy Post-Implant Dosimetry  Diagnosis: 67 y.o. gentleman with stage T1c adenocarcinoma of the prostate with a Gleason's score of 3+4 and a PSA of 6.14  Narrative: On a previous date, Oscar Bailey returned following prostate seed implantation for post implant planning. He underwent CT scan complex simulation to delineate the three-dimensional structures of the pelvis and demonstrate the radiation distribution.  Since that time, the seed localization, and complex isodose planning with dose volume histograms have now been completed.  Results:   Prostate Coverage - The dose of radiation delivered to the 90% or more of the prostate gland (D90) was 97.8% of the prescription dose. This exceeds our goal of greater than 90%. Rectal Sparing - The volume of rectal tissue receiving the prescription dose or higher was 0.0 cc. This falls under our thresholds tolerance of 1.0 cc.  Impression: The prostate seed implant appears to show adequate target coverage and appropriate rectal sparing.  Plan:  The patient will continue to follow with urology for ongoing PSA determinations. I would anticipate a high likelihood for local tumor control with minimal risk for rectal morbidity.  ________________________________  Sheral Apley Tammi Klippel, M.D.

## 2017-12-14 ENCOUNTER — Telehealth: Payer: Self-pay | Admitting: Radiation Oncology

## 2017-12-14 NOTE — Telephone Encounter (Signed)
Received fax refill request from [facility] for patient's tamsulosin. Marked not authorized and faxed back to [phone]. Fax confirmation of delivery obtained. Patient no longer under the care of Dr. Tammi Klippel and should seek refills of tamsulosin from his urologist, Dutch Gray.

## 2017-12-15 ENCOUNTER — Telehealth: Payer: Self-pay | Admitting: Radiation Oncology

## 2017-12-15 NOTE — Telephone Encounter (Signed)
Returned patient's call. Explained [facility] had sent a refill request for his tamsulosin already. Explained I promptly responded the the fax correspondence that refills would need to be requested from Dr. Dutch Gray. Encouraged patient to follow up with [facility] about status. Also, encouraged patient to phone this RN back if he encounters difficulty. Patient verbalized understanding and expressed appreciation for the return call.

## 2017-12-15 NOTE — Telephone Encounter (Signed)
Opened in error

## 2018-07-27 DIAGNOSIS — I10 Essential (primary) hypertension: Secondary | ICD-10-CM | POA: Diagnosis not present

## 2018-07-27 DIAGNOSIS — Z0000 Encounter for general adult medical examination without abnormal findings: Secondary | ICD-10-CM | POA: Diagnosis not present

## 2018-07-27 DIAGNOSIS — Z1159 Encounter for screening for other viral diseases: Secondary | ICD-10-CM | POA: Diagnosis not present

## 2018-07-27 DIAGNOSIS — E7800 Pure hypercholesterolemia, unspecified: Secondary | ICD-10-CM | POA: Diagnosis not present

## 2018-07-27 DIAGNOSIS — Z1389 Encounter for screening for other disorder: Secondary | ICD-10-CM | POA: Diagnosis not present

## 2018-07-27 DIAGNOSIS — Z23 Encounter for immunization: Secondary | ICD-10-CM | POA: Diagnosis not present

## 2018-07-27 DIAGNOSIS — E039 Hypothyroidism, unspecified: Secondary | ICD-10-CM | POA: Diagnosis not present

## 2018-07-29 ENCOUNTER — Other Ambulatory Visit: Payer: Self-pay | Admitting: Family Medicine

## 2018-07-29 DIAGNOSIS — Z136 Encounter for screening for cardiovascular disorders: Secondary | ICD-10-CM

## 2018-08-04 ENCOUNTER — Ambulatory Visit
Admission: RE | Admit: 2018-08-04 | Discharge: 2018-08-04 | Disposition: A | Discharge status: Home / Self Care | Payer: Medicare Other | Source: Ambulatory Visit | Attending: Family Medicine | Admitting: Family Medicine

## 2018-08-04 DIAGNOSIS — Z136 Encounter for screening for cardiovascular disorders: Secondary | ICD-10-CM | POA: Diagnosis not present

## 2018-08-04 DIAGNOSIS — Z87891 Personal history of nicotine dependence: Secondary | ICD-10-CM | POA: Diagnosis not present

## 2018-08-26 ENCOUNTER — Encounter: Payer: Self-pay | Admitting: Cardiovascular Disease

## 2018-08-27 ENCOUNTER — Encounter (INDEPENDENT_AMBULATORY_CARE_PROVIDER_SITE_OTHER): Payer: Self-pay

## 2018-08-27 ENCOUNTER — Ambulatory Visit: Payer: Medicare Other | Admitting: Cardiovascular Disease

## 2018-08-27 ENCOUNTER — Encounter: Payer: Self-pay | Admitting: Cardiovascular Disease

## 2018-08-27 VITALS — BP 110/88 | HR 65 | Ht 67.0 in | Wt 197.0 lb

## 2018-08-27 DIAGNOSIS — I2510 Atherosclerotic heart disease of native coronary artery without angina pectoris: Secondary | ICD-10-CM | POA: Diagnosis not present

## 2018-08-27 DIAGNOSIS — E782 Mixed hyperlipidemia: Secondary | ICD-10-CM

## 2018-08-27 MED ORDER — NITROGLYCERIN 0.4 MG SL SUBL: 0.4000 mg | SUBLINGUAL_TABLET | SUBLINGUAL | 3 refills | Status: DC | PRN

## 2018-08-27 NOTE — Patient Instructions (Signed)
Medication Instructions:  Your physician recommends that you continue on your current medications as directed. Please refer to the Current Medication list given to you today.  If you need a refill on your cardiac medications before your next appointment, please call your pharmacy.   Lab work: Your physician recommends that you return for lab work in: 12 months on the day of or a few days before your office visit with Dr. Acie Fredrickson.  You will need to FAST for this appointment - nothing to eat or drink after midnight the night before except water.   If you have labs (blood work) drawn today and your tests are completely normal, you will receive your results only by: Marland Kitchen MyChart Message (if you have MyChart) OR . A paper copy in the mail If you have any lab test that is abnormal or we need to change your treatment, we will call you to review the results.   Testing/Procedures: None Ordered    Follow-Up: At [facility], you and your health needs are our priority.  As part of our continuing mission to provide you with exceptional heart care, we have created designated Provider Care Teams.  These Care Teams include your primary Cardiologist (physician) and Advanced Practice Providers (APPs -  Physician Assistants and Nurse Practitioners) who all work together to provide you with the care you need, when you need it. You will need a follow up appointment in:  1 years.  Please call our office 2 months in advance to schedule this appointment.  You may see Mertie Moores, MD or one of the following Advanced Practice Providers on your designated Care Team: [name], PA-C [place], [place] . Daune Perch, NP

## 2018-08-27 NOTE — Progress Notes (Signed)
Cardiology Office Note:    Date:  08/27/2018   ID:  Oscar Bailey, DOB 1950-12-27, MRN [id number]  PCP:  Oscar Huxley, PA  Cardiologist:  Oscar Moores, MD  Electrophysiologist:  None   Referring MD: No ref. provider found   Problem list 1.  Coronary artery disease-status post myocardial infarction in 2011 with DES placement at [facility] [place] 2.  Hypertension 3.  Hyperlipidemia 4.  Hypothyroidism-status post radioactive iodine 5.  Prostate cancer 6.    Chief Complaint  Patient presents with  . Coronary Artery Disease    History of Present Illness:    Oscar Bailey is a 67 y.o. male with a hx of coronary artery disease, hypertension, hyperlipidemia. My first visit with [name].  He was previously seen by Oscar Dopp, PA.  He was seen here for pre-op prostate surgery  ( seed implants )   No CP , no dyspnea  He had an abdominal ultrasound performed in October, 2019.  Field very mild aortic ectasia measuring 2.5 cm in maximal diameter.  Left ventricular systolic function is normal with an EF of 55 to 60% by echo in October, 2018. He has moderate aortic stenosis with a mean aortic valve gradient of 20 mmHg.  Has done well over the year.  Walks his dogs  No CP or dyspnea with normal activities  Past Medical History:  Diagnosis Date  . Aortic stenosis    Echo 10/18: EF 55-60, normal wall motion, grade 1 diastolic dysfunction, moderate aortic stenosis (mean 20, peak 35)  . Arthritis    FINGER S   . CAD (coronary artery disease) 2011   Tx with DES ([place], [place]); Dr. Ofilia Neas in [place], [place] // [facility] 5/11 [facility]): Mid LAD 30, proximal D1 80, RCA 50 >> PCI: 2.25 x 20 mm taxus DES to the D1  . Heart murmur   . History of echocardiogram    Echo 5/11 [facility]): EF 55  . HLD (hyperlipidemia)   . HTN (hypertension)   . Hypothyroidism    hyperthyroidism >> s/p RAI treatment 2002  .  Malignant neoplasm of prostate ([place]) 06/01/2017  . Myocardial infarction [facility] [place] [facility]) 2011   STENT PLACED     Past Surgical History:  Procedure Laterality Date  . CARDIAC CATHETERIZATION  2011   WITH STENT L DONE AT [facility] IN [place]   . CYSTOSCOPY N/A 09/24/2017   Procedure: CYSTOSCOPY;  Surgeon: Oscar Bring, MD;  Location: [facility];  Service: Urology;  Laterality: N/A;  . HERNIA REPAIR  2010   inguinal  . LUMBAR LAMINECTOMY Right 2015  . PROSTATE BIOPSY  04/2017  . RADIOACTIVE SEED IMPLANT N/A 09/24/2017   Procedure: RADIOACTIVE SEED IMPLANT/BRACHYTHERAPY IMPLANT;  Surgeon: Oscar Bring, MD;  Location: [facility];  Service: Urology;  Laterality: N/A;  . SPACE OAR INSTILLATION N/A 09/24/2017   Procedure: SPACE OAR INSTILLATION;  Surgeon: Oscar Bring, MD;  Location: [facility];  Service: Urology;  Laterality: N/A;  . TOTAL HIP ARTHROPLASTY Right 2013    Current Medications: Current Meds  Medication Sig  . aspirin EC 81 MG tablet Take 81 mg by mouth daily.  Marland Kitchen atorvastatin (LIPITOR) 40 MG tablet Take 60 mg by mouth daily. Takes 1.5 tablets  . levothyroxine (SYNTHROID, LEVOTHROID) 150 MCG tablet Take 150 mcg by mouth every other day. Alternates and takes 150 mcg 1 day and 175 mcg the next day  . levothyroxine (SYNTHROID, LEVOTHROID) 175 MCG  tablet Take 175 mcg by mouth every other day. Alternates and takes 150 mcg 1 day and 175 mcg the next day  . lisinopril (PRINIVIL,ZESTRIL) 5 MG tablet Take 5 mg by mouth daily.  . metoprolol tartrate (LOPRESSOR) 25 MG tablet Take 12.5 mg by mouth 2 (two) times daily.   . tamsulosin (FLOMAX) 0.4 MG CAPS capsule Take 1 capsule (0.4 mg total) by mouth 2 (two) times daily.  . [DISCONTINUED] nitroGLYCERIN (NITROSTAT) 0.4 MG SL tablet Place 0.4 mg under the tongue every 5 (five) minutes as needed for chest pain.     Allergies:   Patient has no known allergies.   Social History   Socioeconomic History  . Marital status: Single    Spouse name: Not  on file  . Number of children: Not on file  . Years of education: Not on file  . Highest education level: Not on file  Occupational History  . Occupation: retired  Scientific laboratory technician  . Financial resource strain: Not on file  . Food insecurity:    Worry: Not on file    Inability: Not on file  . Transportation needs:    Medical: Not on file    Non-medical: Not on file  Tobacco Use  . Smoking status: Current Every Day Smoker    Packs/day: 0.75    Years: 39.00    Pack years: 29.25    Types: Cigarettes  . Smokeless tobacco: Never Used  Substance and Sexual Activity  . Alcohol use: Yes    Alcohol/week: 10.0 standard drinks    Types: 10 Cans of beer per week  . Drug use: No  . Sexual activity: Yes  Lifestyle  . Physical activity:    Days per week: Not on file    Minutes per session: Not on file  . Stress: Not on file  Relationships  . Social connections:    Talks on phone: Not on file    Gets together: Not on file    Attends religious service: Not on file    Active member of club or organization: Not on file    Attends meetings of clubs or organizations: Not on file    Relationship status: Not on file  Other Topics Concern  . 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   Moved to [place] from [place], [place] in 6.2018   Engaged   1 son -37 yo (Master's Degree candidate in Engineer, mining at [employer])     Family History: The patient's family history includes Alzheimer's disease in his mother; CAD in his father; Cancer in his brother; Dementia in his father; Heart attack in his paternal uncle.  ROS:   Please see the history of present illness.     All other systems reviewed and are negative.  EKGs/Labs/Other Studies Reviewed:    The following studies were reviewed today:   EKG:    Nov. 15, 2019:   NSR at 66.  Normal   Recent Labs: 09/22/2017: ALT 39; BUN 19; Creatinine, Ser 0.95; Hemoglobin 15.6; Platelets 228; Potassium 4.1; Sodium 139   Recent Lipid Panel No results found for: CHOL, TRIG, HDL, CHOLHDL, VLDL, LDLCALC, LDLDIRECT  Physical Exam:    VS:  BP 110/88   Pulse 65   Ht 5\' 7"  (1.702 m)   Wt 197 lb (89.4 kg)   SpO2 93%   BMI 30.85 kg/m     Wt Readings from Last 3 Encounters:  08/27/18 197 lb (89.4  kg)  10/15/17 207 lb (93.9 kg)  09/24/17 210 lb (95.3 kg)     GEN:  Well nourished, well developed in no acute distress HEENT: Normal NECK: No JVD; No carotid bruits LYMPHATICS: No lymphadenopathy CARDIAC:  RR , 1-2 / 6 systolic murmur  RESPIRATORY:  Clear to auscultation without rales, wheezing or rhonchi  ABDOMEN: Soft, non-tender, non-distended MUSCULOSKELETAL:  No edema; No deformity  SKIN: Warm and dry NEUROLOGIC:  Alert and oriented x 3 PSYCHIATRIC:  Normal affect   ASSESSMENT:    1. Mixed hyperlipidemia   2. Coronary artery disease involving native coronary artery of native heart without angina pectoris    PLAN:    In order of problems listed above:  1.  CAD - doing well.   No angina  Lipid levels look great.  Continue current medications.  I will see him again in 1 year.  Will check fasting lipid profile, hepatic profile, basic metabolic profile at that time.    Medication Adjustments/Labs and Tests Ordered: Current medicines are reviewed at length with the patient today.  Concerns regarding medicines are outlined above.  Orders Placed This Encounter  Procedures  . Lipid Profile  . Basic Metabolic Panel (BMET)  . Hepatic function panel  . EKG 12-Lead   Meds ordered this encounter  Medications  . nitroGLYCERIN (NITROSTAT) 0.4 MG SL tablet    Sig: Place 1 tablet (0.4 mg total) under the tongue every 5 (five) minutes as needed for chest pain.    Dispense:  25 tablet    Refill:  3    Patient Instructions  Medication Instructions:  Your physician recommends that you continue on your current medications as directed. Please refer to the Current Medication list given to you  today.  If you need a refill on your cardiac medications before your next appointment, please call your pharmacy.   Lab work: Your physician recommends that you return for lab work in: 12 months on the day of or a few days before your office visit with Dr. Acie Fredrickson.  You will need to FAST for this appointment - nothing to eat or drink after midnight the night before except water.   If you have labs (blood work) drawn today and your tests are completely normal, you will receive your results only by: Marland Kitchen MyChart Message (if you have MyChart) OR . A paper copy in the mail If you have any lab test that is abnormal or we need to change your treatment, we will call you to review the results.   Testing/Procedures: None Ordered    Follow-Up: At [facility], you and your health needs are our priority.  As part of our continuing mission to provide you with exceptional heart care, we have created designated Provider Care Teams.  These Care Teams include your primary Cardiologist (physician) and Advanced Practice Providers (APPs -  Physician Assistants and Nurse Practitioners) who all work together to provide you with the care you need, when you need it. You will need a follow up appointment in:  1 years.  Please call our office 2 months in advance to schedule this appointment.  You may see Oscar Moores, MD or one of the following Advanced Practice Providers on your designated Care Team: Oscar Dopp, PA-C [place], [place] . Daune Perch, NP       Signed, Oscar Moores, MD  08/27/2018 10:17 AM    [place]

## 2019-01-27 DIAGNOSIS — I10 Essential (primary) hypertension: Secondary | ICD-10-CM | POA: Diagnosis not present

## 2019-01-27 DIAGNOSIS — I2510 Atherosclerotic heart disease of native coronary artery without angina pectoris: Secondary | ICD-10-CM | POA: Diagnosis not present

## 2019-01-27 DIAGNOSIS — E7800 Pure hypercholesterolemia, unspecified: Secondary | ICD-10-CM | POA: Diagnosis not present

## 2019-01-27 DIAGNOSIS — E039 Hypothyroidism, unspecified: Secondary | ICD-10-CM | POA: Diagnosis not present

## 2019-01-27 DIAGNOSIS — Z720 Tobacco use: Secondary | ICD-10-CM | POA: Diagnosis not present

## 2019-04-13 DIAGNOSIS — R3912 Poor urinary stream: Secondary | ICD-10-CM | POA: Diagnosis not present

## 2019-09-07 DIAGNOSIS — I10 Essential (primary) hypertension: Secondary | ICD-10-CM | POA: Diagnosis not present

## 2019-09-07 DIAGNOSIS — Z23 Encounter for immunization: Secondary | ICD-10-CM | POA: Diagnosis not present

## 2019-09-07 DIAGNOSIS — E7800 Pure hypercholesterolemia, unspecified: Secondary | ICD-10-CM | POA: Diagnosis not present

## 2019-09-07 DIAGNOSIS — E039 Hypothyroidism, unspecified: Secondary | ICD-10-CM | POA: Diagnosis not present

## 2019-09-07 DIAGNOSIS — Z0000 Encounter for general adult medical examination without abnormal findings: Secondary | ICD-10-CM | POA: Diagnosis not present

## 2019-10-13 DIAGNOSIS — E039 Hypothyroidism, unspecified: Secondary | ICD-10-CM | POA: Diagnosis not present

## 2019-10-13 DIAGNOSIS — I10 Essential (primary) hypertension: Secondary | ICD-10-CM | POA: Diagnosis not present

## 2019-10-13 DIAGNOSIS — I2510 Atherosclerotic heart disease of native coronary artery without angina pectoris: Secondary | ICD-10-CM | POA: Diagnosis not present

## 2019-10-24 DIAGNOSIS — K1329 Other disturbances of oral epithelium, including tongue: Secondary | ICD-10-CM | POA: Diagnosis not present

## 2019-10-28 DIAGNOSIS — R3912 Poor urinary stream: Secondary | ICD-10-CM | POA: Diagnosis not present

## 2019-10-31 ENCOUNTER — Ambulatory Visit: Payer: Medicare Other | Admitting: Cardiovascular Disease

## 2019-10-31 ENCOUNTER — Encounter: Payer: Self-pay | Admitting: Cardiovascular Disease

## 2019-10-31 ENCOUNTER — Other Ambulatory Visit: Payer: Self-pay

## 2019-10-31 VITALS — BP 130/70 | HR 75 | Ht 67.0 in | Wt 217.0 lb

## 2019-10-31 DIAGNOSIS — I10 Essential (primary) hypertension: Secondary | ICD-10-CM | POA: Diagnosis not present

## 2019-10-31 DIAGNOSIS — I2510 Atherosclerotic heart disease of native coronary artery without angina pectoris: Secondary | ICD-10-CM | POA: Diagnosis not present

## 2019-10-31 DIAGNOSIS — E782 Mixed hyperlipidemia: Secondary | ICD-10-CM | POA: Diagnosis not present

## 2019-10-31 MED ORDER — NITROGLYCERIN 0.4 MG SL SUBL: 0.4000 mg | SUBLINGUAL_TABLET | SUBLINGUAL | 3 refills | Status: DC | PRN

## 2019-10-31 NOTE — Progress Notes (Signed)
Cardiology Office Note:    Date:  10/31/2019   ID:  Oscar Bailey, DOB [birth year], MRN [id number]  PCP:  Lois Huxley, PA  Cardiologist:  Mertie Moores, MD  Electrophysiologist:  None   Referring MD: Lois Huxley, PA   Problem list 1.  Coronary artery disease-status post myocardial infarction in 2011 with DES placement at [facility], [place] 2.  Hypertension 3.  Hyperlipidemia 4.  Hypothyroidism-status post radioactive iodine 5.  Prostate cancer 6.    Chief Complaint  Patient presents with  . Hyperlipidemia    Previous notes. :    Oscar Bailey is a 69 y.o. male with a hx of coronary artery disease, hypertension, hyperlipidemia. My first visit with Mr. Suit.  He was previously seen by [name], PA.  He was seen here for pre-op prostate surgery  ( seed implants )   No CP , no dyspnea  He had an abdominal ultrasound performed in October, 2019.  Field very mild aortic ectasia measuring 2.5 cm in maximal diameter.  Left ventricular systolic function is normal with an EF of 55 to 60% by echo in October, 2018. He has moderate aortic stenosis with a mean aortic valve gradient of 20 mmHg.  Has done well over the year.  Walks his dogs  No CP or dyspnea with normal activities  Jan. 18, 2021: Oscar Bailey is a 69 year old gentleman with a history of CAD, hypertension, hyperlipidemia.  Blood work from Oct. 2020 from  his primary medical doctor reveals a total cholesterol of 204.  The triglyceride level is 254.  The HDL is 57.  The LDL is 96.  The atorvastatin was increased to 80 mg a day because of this lab value.  TSH was 0.42.  Basic metabolic profile and liver enzymes were within normal limits.  CBC was normal.  No CP or dyspnea.   Past Medical History:  Diagnosis Date  . Aortic stenosis    Echo 10/18: EF 55-60, normal wall motion, grade 1 diastolic dysfunction, moderate aortic stenosis (mean 20, peak 35)  . Arthritis    FINGER S   . CAD  (coronary artery disease) 2011   Tx with DES ([place], [place]); Dr. Ofilia Neas in [place], [place] // [facility] 5/11 [facility]): Mid LAD 30, proximal D1 80, RCA 50 >> PCI: 2.25 x 20 mm taxus DES to the D1  . Heart murmur   . History of echocardiogram    Echo 5/11 [facility]): EF 55  . HLD (hyperlipidemia)   . HTN (hypertension)   . Hypothyroidism    hyperthyroidism >> s/p RAI treatment 2002  . Malignant neoplasm of prostate ([place]) 06/01/2017  . Myocardial infarction [facility]) 2011   STENT PLACED     Past Surgical History:  Procedure Laterality Date  . CARDIAC CATHETERIZATION  2011   WITH STENT L DONE AT [facility] IN [place]   . CYSTOSCOPY N/A 09/24/2017   Procedure: CYSTOSCOPY;  Surgeon: Raynelle Bring, MD;  Location: [facility];  Service: Urology;  Laterality: N/A;  . HERNIA REPAIR  2010   inguinal  . LUMBAR LAMINECTOMY Right 2015  . PROSTATE BIOPSY  04/2017  . RADIOACTIVE SEED IMPLANT N/A 09/24/2017   Procedure: RADIOACTIVE SEED IMPLANT/BRACHYTHERAPY IMPLANT;  Surgeon: Raynelle Bring, MD;  Location: [facility];  Service: Urology;  Laterality: N/A;  . SPACE OAR INSTILLATION N/A 09/24/2017   Procedure: SPACE OAR INSTILLATION;  Surgeon: Raynelle Bring, MD;  Location: [facility];  Service: Urology;  Laterality: N/A;  . TOTAL HIP ARTHROPLASTY Right 2013    Current Medications: Current Meds  Medication Sig  . aspirin EC 81 MG tablet Take 81 mg by mouth daily.  Marland Kitchen atorvastatin (LIPITOR) 80 MG tablet Take 80 mg by mouth daily.  Marland Kitchen levothyroxine (SYNTHROID, LEVOTHROID) 150 MCG tablet Take 150 mcg by mouth every other day. Alternates and takes 150 mcg 1 day and 175 mcg the next day  . levothyroxine (SYNTHROID, LEVOTHROID) 175 MCG tablet Take 175 mcg by mouth every other day. Alternates and takes 150 mcg 1 day and 175 mcg the next day  . lisinopril (PRINIVIL,ZESTRIL) 5 MG tablet Take 5 mg by mouth daily.  . metoprolol tartrate (LOPRESSOR) 25 MG tablet Take 12.5 mg  by mouth 2 (two) times daily.   . nitroGLYCERIN (NITROSTAT) 0.4 MG SL tablet Place 1 tablet (0.4 mg total) under the tongue every 5 (five) minutes as needed for chest pain.  . tamsulosin (FLOMAX) 0.4 MG CAPS capsule Take 1 capsule (0.4 mg total) by mouth 2 (two) times daily.  . [DISCONTINUED] nitroGLYCERIN (NITROSTAT) 0.4 MG SL tablet Place 1 tablet (0.4 mg total) under the tongue every 5 (five) minutes as needed for chest pain.     Allergies:   Patient has no known allergies.   Social History   Socioeconomic History  . Marital status: Single    Spouse name: Not on file  . Number of children: Not on file  . Years of education: Not on file  . Highest education level: Not on file  Occupational History  . Occupation: retired  Tobacco Use  . Smoking status: Current Every Day Smoker    Packs/day: 0.75    Years: 39.00    Pack years: 29.25    Types: Cigarettes  . Smokeless tobacco: Never Used  Substance and Sexual Activity  . Alcohol use: Yes    Alcohol/week: 10.0 standard drinks    Types: 10 Cans of beer per week  . Drug use: No  . Sexual activity: Yes  Other Topics Concern  . 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   Moved to [place] from [place], [place] in 6.2018   Engaged   1 son -22 yo (Master's Degree candidate in Engineer, mining at [employer])   Social Determinants of Health   Financial Resource Strain:   . Difficulty of Paying Living Expenses: Not on file  Food Insecurity:   . Worried About Charity fundraiser in the Last Year: Not on file  . Ran Out of Food in the Last Year: Not on file  Transportation Needs:   . Lack of Transportation (Medical): Not on file  . Lack of Transportation (Non-Medical): Not on file  Physical Activity:   . Days of Exercise per Week: Not on file  . Minutes of Exercise per Session: Not on file  Stress:   . Feeling of Stress : Not on file  Social Connections:   . Frequency of Communication with Friends  and Family: Not on file  . Frequency of Social Gatherings with Friends and Family: Not on file  . Attends Religious Services: Not on file  . Active Member of Clubs or Organizations: Not on file  . Attends Archivist Meetings: Not on file  . Marital Status: Not on file     Family History: The patient's family history includes Alzheimer's disease in his mother; CAD in his father; Cancer in his  brother; Dementia in his father; Heart attack in his paternal uncle.  ROS:   Please see the history of present illness.     All other systems reviewed and are negative.  EKGs/Labs/Other Studies Reviewed:    The following studies were reviewed today:   EKG:    Nov. 15, 2019:   NSR at 65.  Normal   Recent Labs: No results found for requested labs within last 8760 hours.  Recent Lipid Panel No results found for: CHOL, TRIG, HDL, CHOLHDL, VLDL, LDLCALC, LDLDIRECT  Physical Exam:    Physical Exam: Blood pressure 130/70, pulse 75, height 5\' 7"  (1.702 m), weight 217 lb (98.4 kg), SpO2 96 %.  GEN:  Well nourished, well developed in no acute distress HEENT: Normal NECK: No JVD; No carotid bruits LYMPHATICS: No lymphadenopathy CARDIAC: RRR ,  99991111 systolic murmur  RESPIRATORY:  Clear to auscultation without rales, wheezing or rhonchi  ABDOMEN: Soft, non-tender, non-distended MUSCULOSKELETAL:  No edema; No deformity  SKIN: Warm and dry NEUROLOGIC:  Alert and oriented x 3    ASSESSMENT:    1. Mixed hyperlipidemia   2. Coronary artery disease involving native coronary artery of native heart without angina pectoris   3. Essential hypertension    PLAN:    In order of problems listed above:  1.    CAD -   No angina .   Refill NTG   2.  Aortic stenosis.  He has a moderate systolic murmur.  The echocardiogram from October, 2018 reveals moderate aortic stenosis.  He has good pulses and no symptoms.  We will continue to follow.  3.  Hyperlipidemia: His last lipid levels at at his  primary medical doctor's office were slightly elevated.  The atorvastatin was increased to 80 mg a day.  He will follow up with his primary md   Will have him see [name] , PA in 1 year  Medication Adjustments/Labs and Tests Ordered: Current medicines are reviewed at length with the patient today.  Concerns regarding medicines are outlined above.  Orders Placed This Encounter  Procedures  . EKG 12-Lead   Meds ordered this encounter  Medications  . nitroGLYCERIN (NITROSTAT) 0.4 MG SL tablet    Sig: Place 1 tablet (0.4 mg total) under the tongue every 5 (five) minutes as needed for chest pain.    Dispense:  25 tablet    Refill:  3    Patient Instructions  Medication Instructions:  Your physician recommends that you continue on your current medications as directed. Please refer to the Current Medication list given to you today.  *If you need a refill on your cardiac medications before your next appointment, please call your pharmacy*  Lab Work: None Ordered  If you have labs (blood work) drawn today and your tests are completely normal, you will receive your results only by: Marland Kitchen MyChart Message (if you have MyChart) OR . A paper copy in the mail If you have any lab test that is abnormal or we need to change your treatment, we will call you to review the results.   Testing/Procedures: None Ordered   Follow-Up: At [facility], you and your health needs are our priority.  As part of our continuing mission to provide you with exceptional heart care, we have created designated Provider Care Teams.  These Care Teams include your primary Cardiologist (physician) and Advanced Practice Providers (APPs -  Physician Assistants and Nurse Practitioners) who all work together to provide you with the care  you need, when you need it.  Your next appointment:   1 year(s)  The format for your next appointment:   In Person  Provider:   [name], PA-C      Signed, Mertie Moores, MD  10/31/2019 5:19 PM    [place]

## 2019-10-31 NOTE — Patient Instructions (Signed)
Medication Instructions:  Your physician recommends that you continue on your current medications as directed. Please refer to the Current Medication list given to you today.  *If you need a refill on your cardiac medications before your next appointment, please call your pharmacy*   Lab Work: None Ordered If you have labs (blood work) drawn today and your tests are completely normal, you will receive your results only by: MyChart Message (if you have MyChart) OR A paper copy in the mail If you have any lab test that is abnormal or we need to change your treatment, we will call you to review the results.   Testing/Procedures: None Ordered   Follow-Up: At [facility], you and your health needs are our priority.  As part of our continuing mission to provide you with exceptional heart care, we have created designated Provider Care Teams.  These Care Teams include your primary Cardiologist (physician) and Advanced Practice Providers (APPs -  Physician Assistants and Nurse Practitioners) who all work together to provide you with the care you need, when you need it.    Your next appointment:   1 year(s)  The format for your next appointment:   In Person  Provider:   Scott Weaver, PA-C   

## 2019-11-14 DIAGNOSIS — K1329 Other disturbances of oral epithelium, including tongue: Secondary | ICD-10-CM | POA: Diagnosis not present

## 2019-12-09 DIAGNOSIS — E7800 Pure hypercholesterolemia, unspecified: Secondary | ICD-10-CM | POA: Diagnosis not present

## 2019-12-09 DIAGNOSIS — Z79899 Other long term (current) drug therapy: Secondary | ICD-10-CM | POA: Diagnosis not present

## 2019-12-12 ENCOUNTER — Ambulatory Visit: Payer: Medicare Other | Attending: Internal Medicine

## 2019-12-12 DIAGNOSIS — Z23 Encounter for immunization: Secondary | ICD-10-CM | POA: Insufficient documentation

## 2019-12-12 NOTE — Progress Notes (Signed)
   Covid-19 Vaccination Clinic  Name:  Oscar Bailey    MRN: [id number] DOB: 11-27-1950  12/12/2019  [name] was observed post Covid-19 immunization for 15 minutes without incidence. He was provided with Vaccine Information Sheet and instruction to access the V-Safe system.   [name] was instructed to call 911 with any severe reactions post vaccine: Marland Kitchen Difficulty breathing  . Swelling of your face and throat  . A fast heartbeat  . A bad rash all over your body  . Dizziness and weakness    Immunizations Administered    Name Date Dose VIS Date Route   Pfizer COVID-19 Vaccine 12/12/2019 10:46 AM 0.3 mL 09/23/2019 Intramuscular   Manufacturer: [place]   Lot: [id number]   [place]: [id number]

## 2019-12-19 DIAGNOSIS — K1321 Leukoplakia of oral mucosa, including tongue: Secondary | ICD-10-CM | POA: Diagnosis not present

## 2019-12-23 DIAGNOSIS — K1379 Other lesions of oral mucosa: Secondary | ICD-10-CM | POA: Diagnosis not present

## 2020-01-03 DIAGNOSIS — I10 Essential (primary) hypertension: Secondary | ICD-10-CM | POA: Diagnosis not present

## 2020-01-03 DIAGNOSIS — I2510 Atherosclerotic heart disease of native coronary artery without angina pectoris: Secondary | ICD-10-CM | POA: Diagnosis not present

## 2020-01-03 DIAGNOSIS — E039 Hypothyroidism, unspecified: Secondary | ICD-10-CM | POA: Diagnosis not present

## 2020-01-11 ENCOUNTER — Ambulatory Visit: Payer: Medicare Other | Attending: Internal Medicine

## 2020-01-11 DIAGNOSIS — Z23 Encounter for immunization: Secondary | ICD-10-CM

## 2020-01-11 NOTE — Progress Notes (Signed)
   Covid-19 Vaccination Clinic  Name:  Oscar Bailey    MRN: [id number] DOB: 03-Jan-1951  01/11/2020  [name] was observed post Covid-19 immunization for 15 minutes without incident. He was provided with Vaccine Information Sheet and instruction to access the V-Safe system.   [name] was instructed to call 911 with any severe reactions post vaccine: Marland Kitchen Difficulty breathing  . Swelling of face and throat  . A fast heartbeat  . A bad rash all over body  . Dizziness and weakness   Immunizations Administered    Name Date Dose VIS Date Route   Pfizer COVID-19 Vaccine 01/11/2020  8:25 AM 0.3 mL 09/23/2019 Intramuscular   Manufacturer: [employer], [employer]   Lot: [id number]   [place]: [id number]

## 2020-02-14 DIAGNOSIS — E7800 Pure hypercholesterolemia, unspecified: Secondary | ICD-10-CM | POA: Diagnosis not present

## 2020-03-06 DIAGNOSIS — I10 Essential (primary) hypertension: Secondary | ICD-10-CM | POA: Diagnosis not present

## 2020-03-06 DIAGNOSIS — I25119 Atherosclerotic heart disease of native coronary artery with unspecified angina pectoris: Secondary | ICD-10-CM | POA: Diagnosis not present

## 2020-03-06 DIAGNOSIS — E7800 Pure hypercholesterolemia, unspecified: Secondary | ICD-10-CM | POA: Diagnosis not present

## 2020-03-06 DIAGNOSIS — I350 Nonrheumatic aortic (valve) stenosis: Secondary | ICD-10-CM | POA: Diagnosis not present

## 2020-03-06 DIAGNOSIS — E039 Hypothyroidism, unspecified: Secondary | ICD-10-CM | POA: Diagnosis not present

## 2020-04-24 DIAGNOSIS — R3121 Asymptomatic microscopic hematuria: Secondary | ICD-10-CM | POA: Diagnosis not present

## 2020-04-24 DIAGNOSIS — R3914 Feeling of incomplete bladder emptying: Secondary | ICD-10-CM | POA: Diagnosis not present

## 2020-04-25 DIAGNOSIS — E7800 Pure hypercholesterolemia, unspecified: Secondary | ICD-10-CM | POA: Diagnosis not present

## 2020-04-25 DIAGNOSIS — I10 Essential (primary) hypertension: Secondary | ICD-10-CM | POA: Diagnosis not present

## 2020-04-25 DIAGNOSIS — I2510 Atherosclerotic heart disease of native coronary artery without angina pectoris: Secondary | ICD-10-CM | POA: Diagnosis not present

## 2020-04-25 DIAGNOSIS — E039 Hypothyroidism, unspecified: Secondary | ICD-10-CM | POA: Diagnosis not present

## 2020-05-21 DIAGNOSIS — I2510 Atherosclerotic heart disease of native coronary artery without angina pectoris: Secondary | ICD-10-CM | POA: Diagnosis not present

## 2020-05-21 DIAGNOSIS — I10 Essential (primary) hypertension: Secondary | ICD-10-CM | POA: Diagnosis not present

## 2020-05-21 DIAGNOSIS — E039 Hypothyroidism, unspecified: Secondary | ICD-10-CM | POA: Diagnosis not present

## 2020-05-21 DIAGNOSIS — E7800 Pure hypercholesterolemia, unspecified: Secondary | ICD-10-CM | POA: Diagnosis not present

## 2020-06-12 DIAGNOSIS — M545 Low back pain: Secondary | ICD-10-CM | POA: Diagnosis not present

## 2020-06-25 DIAGNOSIS — M545 Low back pain: Secondary | ICD-10-CM | POA: Diagnosis not present

## 2020-06-25 DIAGNOSIS — M62830 Muscle spasm of back: Secondary | ICD-10-CM | POA: Diagnosis not present

## 2020-06-25 DIAGNOSIS — M5431 Sciatica, right side: Secondary | ICD-10-CM | POA: Diagnosis not present

## 2020-06-25 DIAGNOSIS — M9903 Segmental and somatic dysfunction of lumbar region: Secondary | ICD-10-CM | POA: Diagnosis not present

## 2020-06-26 DIAGNOSIS — M62830 Muscle spasm of back: Secondary | ICD-10-CM | POA: Diagnosis not present

## 2020-06-26 DIAGNOSIS — M545 Low back pain: Secondary | ICD-10-CM | POA: Diagnosis not present

## 2020-06-26 DIAGNOSIS — M5431 Sciatica, right side: Secondary | ICD-10-CM | POA: Diagnosis not present

## 2020-06-26 DIAGNOSIS — M9903 Segmental and somatic dysfunction of lumbar region: Secondary | ICD-10-CM | POA: Diagnosis not present

## 2020-06-28 DIAGNOSIS — M545 Low back pain: Secondary | ICD-10-CM | POA: Diagnosis not present

## 2020-06-28 DIAGNOSIS — M9903 Segmental and somatic dysfunction of lumbar region: Secondary | ICD-10-CM | POA: Diagnosis not present

## 2020-06-28 DIAGNOSIS — M5431 Sciatica, right side: Secondary | ICD-10-CM | POA: Diagnosis not present

## 2020-06-28 DIAGNOSIS — M62830 Muscle spasm of back: Secondary | ICD-10-CM | POA: Diagnosis not present

## 2020-06-29 DIAGNOSIS — M545 Low back pain: Secondary | ICD-10-CM | POA: Diagnosis not present

## 2020-07-02 DIAGNOSIS — M62830 Muscle spasm of back: Secondary | ICD-10-CM | POA: Diagnosis not present

## 2020-07-02 DIAGNOSIS — M9903 Segmental and somatic dysfunction of lumbar region: Secondary | ICD-10-CM | POA: Diagnosis not present

## 2020-07-02 DIAGNOSIS — M545 Low back pain: Secondary | ICD-10-CM | POA: Diagnosis not present

## 2020-07-02 DIAGNOSIS — M5431 Sciatica, right side: Secondary | ICD-10-CM | POA: Diagnosis not present

## 2020-07-30 DIAGNOSIS — I2510 Atherosclerotic heart disease of native coronary artery without angina pectoris: Secondary | ICD-10-CM | POA: Diagnosis not present

## 2020-07-30 DIAGNOSIS — E039 Hypothyroidism, unspecified: Secondary | ICD-10-CM | POA: Diagnosis not present

## 2020-07-30 DIAGNOSIS — E7800 Pure hypercholesterolemia, unspecified: Secondary | ICD-10-CM | POA: Diagnosis not present

## 2020-07-30 DIAGNOSIS — I10 Essential (primary) hypertension: Secondary | ICD-10-CM | POA: Diagnosis not present

## 2020-09-12 DIAGNOSIS — I25119 Atherosclerotic heart disease of native coronary artery with unspecified angina pectoris: Secondary | ICD-10-CM | POA: Diagnosis not present

## 2020-09-12 DIAGNOSIS — Z1389 Encounter for screening for other disorder: Secondary | ICD-10-CM | POA: Diagnosis not present

## 2020-09-12 DIAGNOSIS — Z0000 Encounter for general adult medical examination without abnormal findings: Secondary | ICD-10-CM | POA: Diagnosis not present

## 2020-09-12 DIAGNOSIS — R946 Abnormal results of thyroid function studies: Secondary | ICD-10-CM | POA: Diagnosis not present

## 2020-09-12 DIAGNOSIS — Z23 Encounter for immunization: Secondary | ICD-10-CM | POA: Diagnosis not present

## 2020-09-12 DIAGNOSIS — I10 Essential (primary) hypertension: Secondary | ICD-10-CM | POA: Diagnosis not present

## 2020-09-12 DIAGNOSIS — E7800 Pure hypercholesterolemia, unspecified: Secondary | ICD-10-CM | POA: Diagnosis not present

## 2020-10-16 DIAGNOSIS — Z8719 Personal history of other diseases of the digestive system: Secondary | ICD-10-CM | POA: Diagnosis not present

## 2020-10-16 DIAGNOSIS — Z86010 Personal history of colonic polyps: Secondary | ICD-10-CM | POA: Diagnosis not present

## 2020-10-26 DIAGNOSIS — E039 Hypothyroidism, unspecified: Secondary | ICD-10-CM | POA: Diagnosis not present

## 2020-11-14 DIAGNOSIS — R3121 Asymptomatic microscopic hematuria: Secondary | ICD-10-CM | POA: Diagnosis not present

## 2020-11-14 DIAGNOSIS — R3912 Poor urinary stream: Secondary | ICD-10-CM | POA: Diagnosis not present

## 2020-11-15 DIAGNOSIS — E7800 Pure hypercholesterolemia, unspecified: Secondary | ICD-10-CM | POA: Diagnosis not present

## 2020-11-15 DIAGNOSIS — E039 Hypothyroidism, unspecified: Secondary | ICD-10-CM | POA: Diagnosis not present

## 2020-11-15 DIAGNOSIS — I2510 Atherosclerotic heart disease of native coronary artery without angina pectoris: Secondary | ICD-10-CM | POA: Diagnosis not present

## 2020-11-15 DIAGNOSIS — I10 Essential (primary) hypertension: Secondary | ICD-10-CM | POA: Diagnosis not present

## 2020-11-15 DIAGNOSIS — I25119 Atherosclerotic heart disease of native coronary artery with unspecified angina pectoris: Secondary | ICD-10-CM | POA: Diagnosis not present

## 2020-12-14 DIAGNOSIS — E7800 Pure hypercholesterolemia, unspecified: Secondary | ICD-10-CM | POA: Diagnosis not present

## 2021-01-18 DIAGNOSIS — I25119 Atherosclerotic heart disease of native coronary artery with unspecified angina pectoris: Secondary | ICD-10-CM | POA: Diagnosis not present

## 2021-01-18 DIAGNOSIS — I2510 Atherosclerotic heart disease of native coronary artery without angina pectoris: Secondary | ICD-10-CM | POA: Diagnosis not present

## 2021-01-18 DIAGNOSIS — E039 Hypothyroidism, unspecified: Secondary | ICD-10-CM | POA: Diagnosis not present

## 2021-01-18 DIAGNOSIS — E7800 Pure hypercholesterolemia, unspecified: Secondary | ICD-10-CM | POA: Diagnosis not present

## 2021-01-18 DIAGNOSIS — I10 Essential (primary) hypertension: Secondary | ICD-10-CM | POA: Diagnosis not present

## 2021-01-22 NOTE — Progress Notes (Addendum)
Cardiology Office Note:    Date:  01/23/2021   ID:  Oscar Bailey, DOB [birth year], MRN [id number]  PCP:  Lois Huxley, PA   [facility]  Cardiologist:  Mertie Moores, MD   Advanced Practice Provider:  Liliane Shi, PA-C Electrophysiologist:  None       Referring MD: Lois Huxley, PA   Chief Complaint:  Follow-up (CAD, Aortic Stenosis )    Patient Profile:     Oscar Bailey is a 70 y.o. male with:   Coronary artery disease   S/p MI in 2011>>PCI: DES to D1  [facility] [place])  Hypertension   Hyperlipidemia   Hypothyroidism 2/2 RAI for tx of hyperthyroidism   Prostate CA  Prior CV studies: AAA [place] 08/04/18 IMPRESSION: Mild abdominal aortic ectasia measuring 2.5 cm in maximum diameter. Ectatic abdominal aorta at risk for aneurysm development. Recommend followup by ultrasound in 5 years.   Echocardiogram 07/14/17 EF 55-60, no RWMA, Gr 1 DD, mod AS (mean 20 mmHg)  GXT 12/25/16 [facility] [place], [place]) [place] 6'15", 88% PMHR, 7.4 METs, no chest pain or ECG changes   [facility] 5/11 [facility]):  Mid LAD 30, proximal D1 80, RCA 50 >> PCI: 2.25 x 20 mm taxus DES to the D1    History of Present Illness:    Oscar Bailey was last seen by Dr. Acie Fredrickson in 10/2019.  He returns for f/u.  He is here alone.  He continues to smoke. He does get short of breath with some activities.  He has also noted fatigue.  He has had a vague chest discomfort from time to time but nothing like his MI.  He has not had syncope, orthopnea, leg edema. He has not had palpitations.    Past Medical History:  Diagnosis Date  . Aortic stenosis    Echo 10/18: EF 55-60, normal wall motion, grade 1 diastolic dysfunction, moderate aortic stenosis (mean 20, peak 35)  . Arthritis    FINGER S   . CAD (coronary artery disease) 2011   Tx with DES ([place], [place]); Dr. Ofilia Neas in [place], [place] // [facility] 5/11 [facility]): Mid LAD  30, proximal D1 80, RCA 50 >> PCI: 2.25 x 20 mm taxus DES to the D1  . Heart murmur   . History of echocardiogram    Echo 5/11 [facility]): EF 55  . HLD (hyperlipidemia)   . HTN (hypertension)   . Hypothyroidism    hyperthyroidism >> s/p RAI treatment 2002  . Malignant neoplasm of prostate ([place]) 06/01/2017  . Myocardial infarction [facility]) 2011   STENT PLACED     Current Medications: Current Meds  Medication Sig  . aspirin EC 81 MG tablet Take 81 mg by mouth daily.  Marland Kitchen ezetimibe (ZETIA) 10 MG tablet Take 10 mg by mouth daily.  Marland Kitchen levothyroxine (SYNTHROID, LEVOTHROID) 150 MCG tablet Take 150 mcg by mouth every other day. Alternates and takes 150 mcg 1 day and 175 mcg the next day  . lisinopril (PRINIVIL,ZESTRIL) 5 MG tablet Take 5 mg by mouth daily.  . metoprolol tartrate (LOPRESSOR) 25 MG tablet Take 12.5 mg by mouth 2 (two) times daily.   . nitroGLYCERIN (NITROSTAT) 0.4 MG SL tablet Place 1 tablet (0.4 mg total) under the tongue every 5 (five) minutes as needed for chest pain.  . rosuvastatin (CRESTOR) 40 MG tablet 1 tablet  . tamsulosin (FLOMAX) 0.4 MG CAPS capsule  Take 1 capsule (0.4 mg total) by mouth 2 (two) times daily.     Allergies:   Patient has no known allergies.   Social History   Tobacco Use  . Smoking status: Current Every Day Smoker    Packs/day: 0.75    Years: 39.00    Pack years: 29.25    Types: Cigarettes  . Smokeless tobacco: Never Used  Vaping Use  . Vaping Use: Never used  Substance Use Topics  . Alcohol use: Yes    Alcohol/week: 10.0 standard drinks    Types: 10 Cans of beer per week  . Drug use: No     Family Hx: The patient's family history includes Alzheimer's disease in his mother; CAD in his father; Cancer in his brother; Dementia in his father; Heart attack in his paternal uncle.  Review of Systems  Gastrointestinal: Negative for hematochezia.  Genitourinary: Negative for hematuria.     EKGs/Labs/Other Test Reviewed:    EKG:   EKG is   ordered today.  The ekg ordered today demonstrates normal sinus rhythm, HR 80, normal axis, septal Qs, non-specific ST-TW changes, QTc 417 ms, no changes.   Recent Labs: No results found for requested labs within last 8760 hours.   Recent Lipid Panel No results found for: CHOL, TRIG, HDL, CHOLHDL, LDLCALC, LDLDIRECT  Labs obtained through [facility] - personally reviewed and interpreted: 12/14/20: TC 141, HDL 52, LDL 46, Trg 285 09/12/20: Hgb 15.4, Cr 1.04, K 4.5, ALT 29  Risk Assessment/Calculations:      Physical Exam:    VS:  BP 124/70   Pulse 80   Ht 5\' 7"  (1.702 m)   Wt 208 lb 12.8 oz (94.7 kg)   SpO2 96%   BMI 32.70 kg/m     Wt Readings from Last 3 Encounters:  01/23/21 208 lb 12.8 oz (94.7 kg)  10/31/19 217 lb (98.4 kg)  08/27/18 197 lb (89.4 kg)     Constitutional:      Appearance: Healthy appearance. Not in distress.  Neck:     Vascular: JVD normal.  Pulmonary:     Effort: Pulmonary effort is normal.     Breath sounds: No wheezing. No rales.  Cardiovascular:     Normal rate. Regular rhythm. Normal S1. Normal S2.     Murmurs: There is a harsh crescendo-decrescendo midsystolic murmur at the URSB. Diminished carotid pulses  Edema:    Peripheral edema absent.  Abdominal:     Palpations: Abdomen is soft. There is no hepatomegaly.  Skin:    General: Skin is warm and dry.  Neurological:     General: No focal deficit present.     Mental Status: Alert and oriented to person, place and time.     Cranial Nerves: Cranial nerves are intact.       ASSESSMENT & PLAN:    1. Coronary artery disease involving native coronary artery of native heart without angina pectoris 2. Precordial chest pain Hx of MI in 2011 tx with DES to D1 [facility]).  POET in 2018 was low risk.  He has had some vague chest symptoms.  He also notes decreased exercise tolerance. He continues to smoke.  He needs f/u on his aortic stenosis.  I will have him undergo an echocardiogram first.   If his AS is not severe, he will be set up for a Lexiscan Myoview.  Continue ASA, beta-blocker, statin.  F/u 6 mos.   3. Nonrheumatic aortic valve stenosis Mod AS by echocardiogram in  2018. Arrange f/u echocardiogram as noted above.  If AS is severe, he will need cardiac catheterization instead of Myoview and referral to TCTS.    4. Mixed hyperlipidemia LDL optimal.  Triglycerides are too high. I have asked him to reduce simple CHOs.  He also drinks a lot of beer. I have asked him to cut back on this as well.  Diet info provided today.  Continue current dose of rosuvastatin, ezetimibe.   5. Essential hypertension The patient's blood pressure is controlled on his current regimen.  Continue current therapy.    6. Ectatic abdominal aorta ([place]) He needs a f/u US in 2024.  7. Tobacco abuse I have recommended cessation.     Shared Decision Making/Informed Consent The risks [chest pain, shortness of breath, cardiac arrhythmias, dizziness, blood pressure fluctuations, myocardial infarction, stroke/transient ischemic attack, nausea, vomiting, allergic reaction, radiation exposure, metallic taste sensation and life-threatening complications (estimated to be 1 in 10,000)], benefits (risk stratification, diagnosing coronary artery disease, treatment guidance) and alternatives of a nuclear stress test were discussed in detail with Mr. Danzer and he agrees to proceed.   Dispo:  Return in about 6 months (around 07/25/2021) for Routine Follow Up, w/ Dr. Acie Fredrickson, or [name], PA-C, in person.   Medication Adjustments/Labs and Tests Ordered: Current medicines are reviewed at length with the patient today.  Concerns regarding medicines are outlined above.  Tests Ordered: Orders Placed This Encounter  Procedures  . Myocardial Perfusion Imaging  . EKG 12-Lead  . ECHOCARDIOGRAM COMPLETE   Medication Changes: No orders of the defined types were placed in this encounter.   Signed, [name], PA-C   01/23/2021 4:49 PM    [facility] [place], [place], [place]  [address] Phone: [phone]; Fax: [phone]

## 2021-01-23 ENCOUNTER — Other Ambulatory Visit: Payer: Self-pay

## 2021-01-23 ENCOUNTER — Encounter: Payer: Self-pay | Admitting: Physician Assistant

## 2021-01-23 ENCOUNTER — Ambulatory Visit: Payer: Medicare Other | Admitting: Physician Assistant

## 2021-01-23 VITALS — BP 124/70 | HR 80 | Ht 67.0 in | Wt 208.8 lb

## 2021-01-23 DIAGNOSIS — I77811 Abdominal aortic ectasia: Secondary | ICD-10-CM | POA: Diagnosis not present

## 2021-01-23 DIAGNOSIS — I2510 Atherosclerotic heart disease of native coronary artery without angina pectoris: Secondary | ICD-10-CM | POA: Diagnosis not present

## 2021-01-23 DIAGNOSIS — I350 Nonrheumatic aortic (valve) stenosis: Secondary | ICD-10-CM

## 2021-01-23 DIAGNOSIS — R072 Precordial pain: Secondary | ICD-10-CM

## 2021-01-23 DIAGNOSIS — E782 Mixed hyperlipidemia: Secondary | ICD-10-CM

## 2021-01-23 DIAGNOSIS — I10 Essential (primary) hypertension: Secondary | ICD-10-CM | POA: Diagnosis not present

## 2021-01-23 NOTE — Patient Instructions (Addendum)
Medication Instructions:  Your physician recommends that you continue on your current medications as directed. Please refer to the Current Medication list given to you today.  *If you need a refill on your cardiac medications before your next appointment, please call your pharmacy*   Lab Work: -None  If you have labs (blood work) drawn today and your tests are completely normal, you will receive your results only by: Marland Kitchen MyChart Message (if you have MyChart) OR . A paper copy in the mail If you have any lab test that is abnormal or we need to change your treatment, we will call you to review the results.   Testing/Procedures: Your physician has requested that you have an echocardiogram. Echocardiography is a painless test that uses sound waves to create images of your heart. It provides your doctor with information about the size and shape of your heart and how well your heart's chambers and valves are working. This procedure takes approximately one hour. There are no restrictions for this procedure.  You are scheduled for a Myocardial Perfusion Imaging Study on Thursday, June 9 at 8:15 am  Please arrive 15 minutes prior to your appointment time for registration and insurance purposes.   The test will take approximately 3 to 4 hours to complete; you may bring reading material. If someone comes with you to your appointment, they will need to remain in the main lobby due to limited space in the testing area.   How to prepare for your Myocardial Perfusion test:   Do not eat or drink 3 hours prior to your test, except you may have water.    Do not consume products containing caffeine (regular or decaffeinated) 12 hours prior to your test (ex: coffee, chocolate, soda, tea)   Do bring a list of your current medications with you. If not listed below, you may take your medications as normal.    Bring any held medication to your appointment, as you may be required to take it once the test is  complete.   Do wear comfortable clothes (no dresses or overalls) and walking shoes. Tennis shoes are preferred. No heels or open toed shoes.  Do not wear cologne, perfume, aftershave or lotions (deodorant is allowed).   If these instructions are not followed, you test will have to be rescheduled.   Please report to [address] for your test. If you have questions or concerns about your appointment, please call the Nuclear Lab at [phone].  If you cannot keep your appointment, please provide 24 hour notification to the Nuclear lab to avoid a possible $50 charge to your account.      Follow-Up: At [facility], you and your health needs are our priority.  As part of our continuing mission to provide you with exceptional heart care, we have created designated Provider Care Teams.  These Care Teams include your primary Cardiologist (physician) and Advanced Practice Providers (APPs -  Physician Assistants and Nurse Practitioners) who all work together to provide you with the care you need, when you need it.  We recommend signing up for the patient portal called "MyChart".  Sign up information is provided on this After Visit Summary.  MyChart is used to connect with patients for Virtual Visits (Telemedicine).  Patients are able to view lab/test results, encounter notes, upcoming appointments, etc.  Non-urgent messages can be sent to your provider as well.   To learn more about what you can do with MyChart, go to [email or web address].  Your next appointment:   6 month(s)  The format for your next appointment:   In Person  Provider:   You may see Mertie Moores, MD or one of the following Advanced Practice Providers on your designated Care Team:    [name], PA-C    Other Instructions  High Triglycerides Eating Plan Triglycerides are a type of fat in the blood. High levels of triglycerides can increase your risk of heart disease and stroke. If your  triglyceride levels are high, choosing the right foods can help lower your triglycerides and keep your heart healthy. Work with your health care provider or a diet and nutrition specialist (dietitian) to develop an eating plan that is right for you. What are tips for following this plan? General guidelines  Lose weight, if you are overweight. For most people, losing 5-10 lbs (2-5 kg) helps lower triglyceride levels. A weight-loss plan may include. ? 30 minutes of exercise at least 5 days a week. ? Reducing the amount of calories, sugar, and fat you eat.  Eat a wide variety of fresh fruits, vegetables, and whole grains. These foods are high in fiber.  Eat foods that contain healthy fats, such as fatty fish, nuts, seeds, and olive oil.  Avoid foods that are high in added sugar, added salt (sodium), saturated fat, and trans fat.  Avoid low-fiber, refined carbohydrates such as white bread, crackers, noodles, and white rice.  Avoid foods with partially hydrogenated oils (trans fats), such as fried foods or stick margarine.  Limit alcohol intake to no more than 1 drink a day for nonpregnant women and 2 drinks a day for men. One drink equals 12 oz of beer, 5 oz of wine, or 1 oz of hard liquor. Your health care provider may recommend that you drink less depending on your overall health.   Reading food labels  Check food labels for the amount of saturated fat. Choose foods with no or very little saturated fat.  Check food labels for the amount of trans fat. Choose foods with no trans fat.  Check food labels for the amount of cholesterol. Choose foods low in cholesterol. Ask your dietitian how much cholesterol you should have each day.  Check food labels for the amount of sodium. Choose foods with less than 140 milligrams (mg) per serving. Shopping  Buy dairy products labeled as nonfat (skim) or low-fat (1%).  Avoid buying processed or prepackaged foods. These are often high in added sugar,  sodium, and fat. Cooking  Choose healthy fats when cooking, such as olive oil or canola oil.  Cook foods using lower fat methods, such as baking, broiling, boiling, or grilling.  Make your own sauces, dressings, and marinades when possible, instead of buying them. Store-bought sauces, dressings, and marinades are often high in sodium and sugar. Meal planning  Eat more home-cooked food and less restaurant, buffet, and fast food.  Eat fatty fish at least 2 times each week. Examples of fatty fish include salmon, trout, mackerel, tuna, and herring.  If you eat whole eggs, do not eat more than 3 egg yolks per week. What foods are recommended? The items listed may not be a complete list. Talk with your dietitian about what dietary choices are best for you. Grains Whole wheat or whole grain breads, crackers, cereals, and pasta. Unsweetened oatmeal. Bulgur. Barley. Quinoa. Brown rice. Whole wheat flour tortillas. Vegetables Fresh or frozen vegetables. Low-sodium canned vegetables. Fruits All fresh, canned (in natural juice), or frozen fruits. Meats and other  protein foods Skinless chicken or [place]. Ground chicken or [place]. Lean cuts of pork, trimmed of fat. Fish and seafood, especially salmon, trout, and herring. Egg whites. Dried beans, peas, or lentils. Unsalted nuts or seeds. Unsalted canned beans. Natural peanut or almond butter. Dairy Low-fat dairy products. Skim or low-fat (1%) milk. Reduced fat (2%) and low-sodium cheese. Low-fat ricotta cheese. Low-fat cottage cheese. Plain, low-fat yogurt. Fats and oils Tub margarine without trans fats. Light or reduced-fat mayonnaise. Light or reduced-fat salad dressings. Avocado. Safflower, olive, sunflower, soybean, and canola oils. What foods are not recommended? The items listed may not be a complete list. Talk with your dietitian about what dietary choices are best for you. Grains White bread. White (regular) pasta. White rice. Cornbread.  Bagels. Pastries. Crackers that contain trans fat. Vegetables Creamed or fried vegetables. Vegetables in a cheese sauce. Fruits Sweetened dried fruit. Canned fruit in syrup. Fruit juice. Meats and other protein foods Fatty cuts of meat. Ribs. Chicken wings. Berniece Salines. Sausage. Bologna. Salami. Chitterlings. Fatback. Hot dogs. Bratwurst. Packaged lunch meats. Dairy Whole or reduced-fat (2%) milk. Half-and-half. Cream cheese. Full-fat or sweetened yogurt. Full-fat cheese. Nondairy creamers. Whipped toppings. Processed cheese or cheese spreads. Cheese curds. Beverages Alcohol. Sweetened drinks, such as soda, lemonade, fruit drinks, or punches. Fats and oils Butter. Stick margarine. Lard. Shortening. Ghee. Bacon fat. Tropical oils, such as coconut, palm kernel, or palm oils. Sweets and desserts Corn syrup. Sugars. Honey. Molasses. Candy. Jam and jelly. Syrup. Sweetened cereals. Cookies. Pies. Cakes. Donuts. Muffins. Ice cream. Condiments Store-bought sauces, dressings, and marinades that are high in sugar, such as ketchup and barbecue sauce. Summary  High levels of triglycerides can increase the risk of heart disease and stroke. Choosing the right foods can help lower your triglycerides.  Eat plenty of fresh fruits, vegetables, and whole grains. Choose low-fat dairy and lean meats. Eat fatty fish at least twice a week.  Avoid processed and prepackaged foods with added sugar, sodium, saturated fat, and trans fat.  If you need suggestions or have questions about what types of food are good for you, talk with your health care provider or a dietitian. This information is not intended to replace advice given to you by your health care provider. Make sure you discuss any questions you have with your health care provider. Document Revised: 02/01/2020 Document Reviewed: 02/01/2020 Elsevier Patient Education  2021 [place] wants you to follow-up in: 6 month with Dr. Acie Fredrickson or  [name], PA.  You will receive a reminder letter in the mail two months in advance. If you don't receive a letter, please call our office to schedule the follow-up appointment.

## 2021-02-26 ENCOUNTER — Other Ambulatory Visit: Payer: Self-pay | Admitting: Physician Assistant

## 2021-02-26 DIAGNOSIS — I2510 Atherosclerotic heart disease of native coronary artery without angina pectoris: Secondary | ICD-10-CM

## 2021-02-26 DIAGNOSIS — R072 Precordial pain: Secondary | ICD-10-CM

## 2021-02-28 ENCOUNTER — Other Ambulatory Visit: Payer: Self-pay

## 2021-02-28 ENCOUNTER — Ambulatory Visit (HOSPITAL_COMMUNITY): Payer: Medicare Other | Attending: Cardiology

## 2021-02-28 DIAGNOSIS — I350 Nonrheumatic aortic (valve) stenosis: Secondary | ICD-10-CM

## 2021-02-28 DIAGNOSIS — I10 Essential (primary) hypertension: Secondary | ICD-10-CM | POA: Diagnosis not present

## 2021-02-28 DIAGNOSIS — I2510 Atherosclerotic heart disease of native coronary artery without angina pectoris: Secondary | ICD-10-CM | POA: Diagnosis not present

## 2021-02-28 DIAGNOSIS — E782 Mixed hyperlipidemia: Secondary | ICD-10-CM | POA: Diagnosis not present

## 2021-02-28 LAB — ECHOCARDIOGRAM COMPLETE
AR max vel: 0.93 cm2
AV Area VTI: 0.94 cm2
AV Area mean vel: 0.87 cm2
AV Mean grad: 31.8 mmHg
AV Peak grad: 55.5 mmHg
Ao pk vel: 3.73 m/s
Area-P 1/2: 3.17 cm2
P 1/2 time: 440 msec
S' Lateral: 2.5 cm

## 2021-03-01 ENCOUNTER — Telehealth: Payer: Self-pay

## 2021-03-01 ENCOUNTER — Encounter: Payer: Self-pay | Admitting: Physician Assistant

## 2021-03-01 DIAGNOSIS — I350 Nonrheumatic aortic (valve) stenosis: Secondary | ICD-10-CM

## 2021-03-01 NOTE — Telephone Encounter (Signed)
The patient has been notified of the result and verbalized understanding.  All questions (if any) were answered. Antonieta Iba, RN 03/01/2021 11:53 AM  Referral has been placed and stress test has been cancelled.

## 2021-03-01 NOTE — Telephone Encounter (Signed)
-----   Message from Liliane Shi, [place] sent at 03/01/2021 11:48 AM EDT ----- Echocardiogram shows worsening aortic stenosis.  I think he should be evaluated by our structural heart team to determine next steps for this condition.  EF is normal.   PLAN:  -Please cancel stress Myoview  -Please refer to structural heart team (Dr. Burt Knack or Dr. Angelena Form) for severe aortic stenosis  -Please send a copy to PCP. [name], PA-C    03/01/2021 11:41 AM

## 2021-03-13 DIAGNOSIS — Z720 Tobacco use: Secondary | ICD-10-CM | POA: Diagnosis not present

## 2021-03-13 DIAGNOSIS — E039 Hypothyroidism, unspecified: Secondary | ICD-10-CM | POA: Diagnosis not present

## 2021-03-13 DIAGNOSIS — E7800 Pure hypercholesterolemia, unspecified: Secondary | ICD-10-CM | POA: Diagnosis not present

## 2021-03-13 DIAGNOSIS — I2510 Atherosclerotic heart disease of native coronary artery without angina pectoris: Secondary | ICD-10-CM | POA: Diagnosis not present

## 2021-03-13 DIAGNOSIS — I10 Essential (primary) hypertension: Secondary | ICD-10-CM | POA: Diagnosis not present

## 2021-03-13 DIAGNOSIS — I350 Nonrheumatic aortic (valve) stenosis: Secondary | ICD-10-CM | POA: Diagnosis not present

## 2021-03-13 DIAGNOSIS — Z1211 Encounter for screening for malignant neoplasm of colon: Secondary | ICD-10-CM | POA: Diagnosis not present

## 2021-03-18 ENCOUNTER — Ambulatory Visit: Payer: Medicare Other | Admitting: Cardiovascular Disease

## 2021-03-18 ENCOUNTER — Encounter: Payer: Self-pay | Admitting: Cardiovascular Disease

## 2021-03-18 ENCOUNTER — Other Ambulatory Visit: Payer: Self-pay

## 2021-03-18 VITALS — BP 136/70 | HR 73 | Ht 67.0 in | Wt 208.2 lb

## 2021-03-18 DIAGNOSIS — Z01818 Encounter for other preprocedural examination: Secondary | ICD-10-CM | POA: Diagnosis not present

## 2021-03-18 DIAGNOSIS — I350 Nonrheumatic aortic (valve) stenosis: Secondary | ICD-10-CM

## 2021-03-18 DIAGNOSIS — Z01812 Encounter for preprocedural laboratory examination: Secondary | ICD-10-CM | POA: Diagnosis not present

## 2021-03-18 NOTE — Patient Instructions (Signed)
Medication Instructions:  No changes *If you need a refill on your cardiac medications before your next appointment, please call your pharmacy*   Lab Work: Today: BMET, CBC If you have labs (blood work) drawn today and your tests are completely normal, you will receive your results only by: Marland Kitchen MyChart Message (if you have MyChart) OR . A paper copy in the mail If you have any lab test that is abnormal or we need to change your treatment, we will call you to review the results.   Testing/Procedures: Your physician has requested that you have a cardiac catheterization. Cardiac catheterization is used to diagnose and/or treat various heart conditions. Doctors may recommend this procedure for a number of different reasons. The most common reason is to evaluate chest pain. Chest pain can be a symptom of coronary artery disease (CAD), and cardiac catheterization can show whether plaque is narrowing or blocking your heart's arteries. This procedure is also used to evaluate the valves, as well as measure the blood flow and oxygen levels in different parts of your heart. For further information please visit [email or web address]. Please follow instruction sheet, as given.  Follow-Up: Theodosia Quay, RN Structural Heart Nurse Navigator will contact you with plans for next steps in planning your valve procedure.  Other Instructions    [facility] [place], [place] [address] Dept: [phone] Loc: [phone]  Oscar Bailey  03/18/2021  You are scheduled for a Cardiac Catheterization on Monday, June 20 with Dr. Lauree Chandler.  1. Please arrive at the [facility] (Main Entrance A) at [facility]: [address] [place], [place] [address] at 5:30 AM (This time is two hours before your procedure to ensure your preparation). Free valet parking service is available.   Special note:  Every effort is made to have your procedure done on time. Please understand that emergencies sometimes delay scheduled procedures.  2. Diet: Do not eat solid foods after midnight.  The patient may have clear liquids until 5am upon the day of the procedure.  3. Labs: You will need to have blood drawn on Monday, June 6 at [facility] at [facility]. [address]  Open: 7:30am - 5pm    Phone: [phone]. You do not need to be fasting.  4. Medication instructions in preparation for your procedure:   Contrast Allergy: No  On the morning of your procedure, take your Aspirin and any morning medicines NOT listed above.  You may use sips of water.  5. Plan for one night stay--bring personal belongings. 6. Bring a current list of your medications and current insurance cards. 7. You MUST have a responsible person to drive you home. 8. Someone MUST be with you the first 24 hours after you arrive home or your discharge will be delayed. 9. Please wear clothes that are easy to get on and off and wear slip-on shoes.  Thank you for allowing [place] to care for you!   -- [facility]

## 2021-03-18 NOTE — H&P (View-Only) (Signed)
[facility] Consult Note  Chief Complaint  Patient presents with  . New Patient (Initial Visit)    Severe aortic stenosis   History of Present Illness: 70 yo male with history of arthritis, CAD, HTN, hyperlipidemia, hypothroidism, prostate cancer and severe aortic stenosis who is here today as a new consult, referred by Dr. Acie Fredrickson, for further discussion regarding his aortic stenosis and possible TAVR. He has been followed for moderate aortic stenosis by Dr. Acie Fredrickson. He has CAD and had PCI with stent placement in the Diagonal branch at [facility] in 2011. Normal stress test in [place] in 2018. Moderate AS by echo in 2018. He has had prostate cancer treated with radiation seeds. He was seen April 2022 by [name], PA-C and c/o decreased exercise tolerance, chest pain. Echo 02/28/21 with LVEF=65-70%. Normal RV function. The aortic valve is calcified and thickened with limited leaflet excursion. Mean gradient 35 mmHg, peak gradient 63 mmHg, AVA 0.87 cm2, dimensionless index 0.27, SVI 33. This is consistent with moderate to severe aortic stenosis.   He tells me today that he has been having dyspnea with exertion. He has no chest pain, dizziness or LE edema. He lives in [place], [place] with his long term girlfriend. He goes to the dentist regularly and has no active dental issues. He is retired from a [employer]. He has been retired in [place] from [place] for 4 years.   Primary Care Physician: Lois Huxley, PA Primary Cardiologist: Acie Fredrickson Referring Cardiologist: Acie Fredrickson  Past Medical History:  Diagnosis Date  . Aortic stenosis    Echo 10/18: EF 55-60, normal wall motion, grade 1 diastolic dysfunction, moderate aortic stenosis (mean 20, peak 35) // Echocardiogram 5/22: EF 65-70, mod to severe AS (mean 35 mmHg, Vmax 396 cm/s, DI 0.25), trivial AI, normal RVSF, trivial MR, mild dilation of ascending aorta (36 mm)  . Arthritis    FINGER S   . CAD (coronary  artery disease) 2011   Tx with DES ([place], [place]); Dr. Ofilia Neas in [place], [place] // [facility] 5/11 [facility]): Mid LAD 30, proximal D1 80, RCA 50 >> PCI: 2.25 x 20 mm taxus DES to the D1  . Heart murmur   . History of echocardiogram    Echo 5/11 [facility]): EF 55  . HLD (hyperlipidemia)   . HTN (hypertension)   . Hypothyroidism    hyperthyroidism >> s/p RAI treatment 2002  . Malignant neoplasm of prostate ([place]) 06/01/2017  . Myocardial infarction [facility]) 2011   STENT PLACED     Past Surgical History:  Procedure Laterality Date  . CARDIAC CATHETERIZATION  2011   WITH STENT L DONE AT [facility] IN [place]   . CYSTOSCOPY N/A 09/24/2017   Procedure: CYSTOSCOPY;  Surgeon: Raynelle Bring, MD;  Location: [facility];  Service: Urology;  Laterality: N/A;  . HERNIA REPAIR  2010   inguinal  . LUMBAR LAMINECTOMY Right 2015  . PROSTATE BIOPSY  04/2017  . RADIOACTIVE SEED IMPLANT N/A 09/24/2017   Procedure: RADIOACTIVE SEED IMPLANT/BRACHYTHERAPY IMPLANT;  Surgeon: Raynelle Bring, MD;  Location: [facility];  Service: Urology;  Laterality: N/A;  . SPACE OAR INSTILLATION N/A 09/24/2017   Procedure: SPACE OAR INSTILLATION;  Surgeon: Raynelle Bring, MD;  Location: [facility];  Service: Urology;  Laterality: N/A;  . TOTAL HIP ARTHROPLASTY Right 2013    Current Outpatient Medications  Medication Sig Dispense Refill  . aspirin EC 81 MG tablet Take 81 mg by  mouth daily.    Marland Kitchen ezetimibe (ZETIA) 10 MG tablet Take 10 mg by mouth daily.    Marland Kitchen levothyroxine (SYNTHROID) 137 MCG tablet Take 1 tablet by mouth daily.    Marland Kitchen lisinopril (PRINIVIL,ZESTRIL) 5 MG tablet Take 5 mg by mouth daily.    . metoprolol tartrate (LOPRESSOR) 25 MG tablet Take 12.5 mg by mouth 2 (two) times daily.     . nitroGLYCERIN (NITROSTAT) 0.4 MG SL tablet Place 1 tablet (0.4 mg total) under the tongue every 5 (five) minutes as needed for chest pain. 25 tablet 3  . rosuvastatin (CRESTOR) 40 MG tablet 1  tablet    . tamsulosin (FLOMAX) 0.4 MG CAPS capsule Take 1 capsule (0.4 mg total) by mouth 2 (two) times daily. 60 capsule 5   No current facility-administered medications for this visit.    No Known Allergies  Social History   Socioeconomic History  . Marital status: Single    Spouse name: Not on file  . Number of children: 1  . Years of education: Not on file  . Highest education level: Not on file  Occupational History  . Occupation: Air cabin crew for [employer]  Tobacco Use  . Smoking status: Current Every Day Smoker    Packs/day: 0.75    Years: 55.00    Pack years: 41.25    Types: Cigarettes  . Smokeless tobacco: Never Used  Vaping Use  . Vaping Use: Never used  Substance and Sexual Activity  . Alcohol use: Yes    Alcohol/week: 10.0 standard drinks    Types: 10 Cans of beer per week    Comment: 2-6 beers per day  . Drug use: No  . Sexual activity: Yes  Other Topics Concern  . 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   Moved to [place] from [place], [place] in 6.2018   Engaged   1 son -67 yo (Master's Degree candidate in Engineer, mining at [employer])   Social Determinants of Health   Financial Resource Strain: Not on file  Food Insecurity: Not on file  Transportation Needs: Not on file  Physical Activity: Not on file  Stress: Not on file  Social Connections: Not on file  Intimate Partner Violence: Not on file    Family History  Problem Relation Age of Onset  . Cancer Brother        prostate  . Alzheimer's disease Mother   . CAD Father        CABG  . Dementia Father   . Heart attack Paternal Uncle     Review of Systems:  As stated in the HPI and otherwise negative.   BP 136/70   Pulse 73   Ht 5\' 7"  (1.702 m)   Wt 208 lb 3.2 oz (94.4 kg)   SpO2 97%   BMI 32.61 kg/m   Physical Examination: General: Well developed, well nourished, NAD  HEENT: OP clear, mucus membranes moist  SKIN: warm, dry.  No rashes. Neuro: No focal deficits  Musculoskeletal: Muscle strength 5/5 all ext  Psychiatric: Mood and affect normal  Neck: No JVD, no carotid bruits, no thyromegaly, no lymphadenopathy.  Lungs:Clear bilaterally, no wheezes, rhonci, crackles Cardiovascular: Regular rate and rhythm. Loud, harsh, late peaking systolic murmur.  Abdomen:Soft. Bowel sounds present. Non-tender.  Extremities: No lower extremity edema. Pulses are 2 + in the bilateral DP/PT.  EKG:  EKG is ordered today. The ekg ordered today demonstrates sinus  Echo 02/28/21: 1.  The aortic valve is calcified. There is moderate-to-severe  calcification of the aortic valve. There is moderate-to-severe thickening  of the aortic valve. Aortic valve regurgitation is trivial. There is  moderate-to-severe aortic valve stenosis with  AVA 0.91cm2 by continuity, mean gradient 79mmHg, peak gradient 59mmHg,  Vmax 3.78m/s, DI 0.25. If clinically indicated/concerned for symptomatic  aortic stenosis, would recommend evaluation by structural team.  2. Left ventricular ejection fraction, by estimation, is 65 to 70%. The  left ventricle has normal function. The left ventricle has no regional  wall motion abnormalities. Left ventricular diastolic parameters are  indeterminate.  3. Right ventricular systolic function is normal. The right ventricular  size is normal. Tricuspid regurgitation signal is inadequate for assessing  PA pressure.  4. The mitral valve is normal in structure. Trivial mitral valve  regurgitation.  5. Aortic dilatation noted. There is mild dilatation of the ascending  aorta, measuring 36 mm.  6. The inferior vena cava is normal in size with greater than 50%  respiratory variability, suggesting right atrial pressure of 3 mmHg.   FINDINGS  Left Ventricle: Left ventricular ejection fraction, by estimation, is 65  to 70%. The left ventricle has normal function. The left ventricle has no  regional wall motion  abnormalities. The left ventricular internal cavity  size was normal in size. There is  borderline concentric left ventricular hypertrophy. Left ventricular  diastolic parameters are indeterminate.   Right Ventricle: The right ventricular size is normal. No increase in  right ventricular wall thickness. Right ventricular systolic function is  normal. Tricuspid regurgitation signal is inadequate for assessing PA  pressure.   Left Atrium: Left atrial size was normal in size.   Right Atrium: Right atrial size was normal in size.   Pericardium: There is no evidence of pericardial effusion.   Mitral Valve: The mitral valve is normal in structure. Trivial mitral  valve regurgitation.   Tricuspid Valve: The tricuspid valve is normal in structure. Tricuspid  valve regurgitation is not demonstrated.   Aortic Valve: The aortic valve is calcified. There is moderate  calcification of the aortic valve. There is moderate thickening of the  aortic valve. Aortic valve regurgitation is trivial. Aortic regurgitation  PHT measures 440 msec. Moderate to severe  aortic stenosis is present.AVA 0.91cm2, mean gradient 19mmHg, peak  gradient 14mmHg, Vmax 3.33m/s, DI 0.25   Pulmonic Valve: The pulmonic valve was normal in structure. Pulmonic valve  regurgitation is trivial.   Aorta: Aortic dilatation noted. There is mild dilatation of the ascending  aorta, measuring 36 mm.   Venous: The inferior vena cava is normal in size with greater than 50%  respiratory variability, suggesting right atrial pressure of 3 mmHg.   IAS/Shunts: No atrial level shunt detected by color flow Doppler.     LEFT VENTRICLE  PLAX 2D  LVIDd:     4.40 cm Diastology  LVIDs:     2.50 cm LV e' medial:  8.08 cm/s  LV PW:     1.00 cm LV E/e' medial: 11.8  LV IVS:    1.00 cm LV e' lateral:  10.40 cm/s  LVOT diam:   2.10 cm LV E/e' lateral: 9.1  LV SV:     67  LV SV Index:  33  LVOT Area:    3.46 cm     RIGHT VENTRICLE       IVC  RV Basal diam: 4.20 cm   IVC diam: 1.60 cm  RV S prime:   19.50 cm/s  TAPSE (M-mode): 2.2 cm   LEFT ATRIUM       Index    RIGHT ATRIUM      Index  LA diam:    3.90 cm 1.89 cm/m RA Area:   14.70 cm  LA Vol (A2C):  55.5 ml 26.95 ml/m RA Volume:  37.60 ml 18.26 ml/m  LA Vol (A4C):  37.6 ml 18.26 ml/m  LA Biplane Vol: 46.7 ml 22.67 ml/m  AORTIC VALVE  AV Area (Vmax):  0.93 cm  AV Area (Vmean):  0.87 cm  AV Area (VTI):   0.94 cm  AV Vmax:      372.60 cm/s  AV Vmean:     263.800 cm/s  AV VTI:      0.718 m  AV Peak Grad:   55.5 mmHg  AV Mean Grad:   31.8 mmHg  LVOT Vmax:     100.25 cm/s  LVOT Vmean:    66.200 cm/s  LVOT VTI:     0.194 m  LVOT/AV VTI ratio: 0.27  AI PHT:      440 msec    AORTA  Ao Root diam: 3.40 cm  Ao Asc diam: 3.60 cm   MITRAL VALVE  MV Area (PHT): 3.17 cm  SHUNTS  MV Decel Time: 239 msec  Systemic VTI: 0.19 m  MV E velocity: 95.10 cm/s Systemic Diam: 2.10 cm  MV A velocity: 79.50 cm/s  MV E/A ratio: 1.20   Recent Labs: No results found for requested labs within last 8760 hours.   Lipid Panel No results found for: CHOL, TRIG, HDL, CHOLHDL, VLDL, LDLCALC, LDLDIRECT   Wt Readings from Last 3 Encounters:  03/18/21 208 lb 3.2 oz (94.4 kg)  01/23/21 208 lb 12.8 oz (94.7 kg)  10/31/19 217 lb (98.4 kg)     Other studies Reviewed: Additional studies/ records that were reviewed today include: echo images, office notes Review of the above records demonstrates: severe AS   Assessment and Plan:   1. Severe Aortic Valve Stenosis: He likely has severe, stage D3 aortic valve stenosis. I have personally reviewed the echo images. The aortic valve is thickened, calcified with limited leaflet mobility. I think he would benefit from AVR. Given advanced age, he is not a good candidate for conventional AVR by surgical approach.  I think he may be a good candidate for TAVR. Will get additional information about severity of AS from cath and CT scans.   STS Risk Score: AVR + CABG Risk of Mortality: 0.931% Renal Failure: 1.293% Permanent Stroke: 1.056% Prolonged Ventilation: 5.483% DSW Infection: 0.237% Reoperation: 2.899% Morbidity or Mortality: 9.079% Short Length of Stay: 57.568% Long Length of Stay: 3.121%   I have reviewed the natural history of aortic stenosis with the patient and their family members  who are present today. We have discussed the limitations of medical therapy and the poor prognosis associated with symptomatic aortic stenosis. We have reviewed potential treatment options, including palliative medical therapy, conventional surgical aortic valve replacement, and transcatheter aortic valve replacement. We discussed treatment options in the context of the patient's specific comorbid medical conditions.   He would like to proceed with planning for TAVR. I will arrange a right and left heart catheterization at [facility] 04/01/21 at 7:30 am.  Risks and benefits of the cath procedure and the valve procedure are reviewed with the patient. After the cath, he will have a cardiac CT, CTA of the chest/abdomen and pelvis, carotid artery dopplers, PT assessment and will then be referred to see  one of the CT surgeons on our TAVR team.      Current medicines are reviewed at length with the patient today.  The patient does not have concerns regarding medicines.  The following changes have been made:  no change  Labs/ tests ordered today include:   Orders Placed This Encounter  Procedures  . CBC  . Basic metabolic panel  . EKG 12-Lead     Disposition:   F/U with the valve team.    Signed, Lauree Chandler, MD 03/18/2021 1:19 PM    [address], [place],   [id number] Phone: [phone]; Fax: [phone]

## 2021-03-18 NOTE — Progress Notes (Signed)
Structural [facility] Clinic Consult Note  Chief Complaint  Patient presents with  . New Patient (Initial Visit)    Severe aortic stenosis   History of Present Illness: 70 yo male with history of arthritis, CAD, HTN, hyperlipidemia, hypothroidism, prostate cancer and severe aortic stenosis who is here today as a new consult, referred by Dr. Acie Fredrickson, for further discussion regarding his aortic stenosis and possible TAVR. He has been followed for moderate aortic stenosis by Dr. Acie Fredrickson. He has CAD and had PCI with stent placement in the Diagonal branch at [facility] in 2011. Normal stress test in [place] in 2018. Moderate AS by echo in 2018. He has had prostate cancer treated with radiation seeds. He was seen April 2022 by [name], PA-C and c/o decreased exercise tolerance, chest pain. Echo 02/28/21 with LVEF=65-70%. Normal RV function. The aortic valve is calcified and thickened with limited leaflet excursion. Mean gradient 35 mmHg, peak gradient 63 mmHg, AVA 0.87 cm2, dimensionless index 0.27, SVI 33. This is consistent with moderate to severe aortic stenosis.   He tells me today that he has been having dyspnea with exertion. He has no chest pain, dizziness or LE edema. He lives in [place], [place] with his long term girlfriend. He goes to the dentist regularly and has no active dental issues. He is retired from a [employer]. He has been retired in [place] from [place] for 4 years.   Primary Care Physician: Lois Huxley, PA Primary Cardiologist: Acie Fredrickson Referring Cardiologist: Acie Fredrickson  Past Medical History:  Diagnosis Date  . Aortic stenosis    Echo 10/18: EF 55-60, normal wall motion, grade 1 diastolic dysfunction, moderate aortic stenosis (mean 20, peak 35) // Echocardiogram 5/22: EF 65-70, mod to severe AS (mean 35 mmHg, Vmax 396 cm/s, DI 0.25), trivial AI, normal RVSF, trivial MR, mild dilation of ascending aorta (36 mm)  . Arthritis    FINGER S   . CAD (coronary  artery disease) 2011   Tx with DES ([place], [place]); Dr. Ofilia Neas in [place], [place] // [facility] 5/11 [facility]): Mid LAD 30, proximal D1 80, RCA 50 >> PCI: 2.25 x 20 mm taxus DES to the D1  . Heart murmur   . History of echocardiogram    Echo 5/11 [facility]): EF 55  . HLD (hyperlipidemia)   . HTN (hypertension)   . Hypothyroidism    hyperthyroidism >> s/p RAI treatment 2002  . Malignant neoplasm of prostate ([place]) 06/01/2017  . Myocardial infarction [facility]) 2011   STENT PLACED     Past Surgical History:  Procedure Laterality Date  . CARDIAC CATHETERIZATION  2011   WITH STENT L DONE AT [facility] IN [place]   . CYSTOSCOPY N/A 09/24/2017   Procedure: CYSTOSCOPY;  Surgeon: Raynelle Bring, MD;  Location: [facility];  Service: Urology;  Laterality: N/A;  . HERNIA REPAIR  2010   inguinal  . LUMBAR LAMINECTOMY Right 2015  . PROSTATE BIOPSY  04/2017  . RADIOACTIVE SEED IMPLANT N/A 09/24/2017   Procedure: RADIOACTIVE SEED IMPLANT/BRACHYTHERAPY IMPLANT;  Surgeon: Raynelle Bring, MD;  Location: [facility];  Service: Urology;  Laterality: N/A;  . SPACE OAR INSTILLATION N/A 09/24/2017   Procedure: SPACE OAR INSTILLATION;  Surgeon: Raynelle Bring, MD;  Location: [facility];  Service: Urology;  Laterality: N/A;  . TOTAL HIP ARTHROPLASTY Right 2013    Current Outpatient Medications  Medication Sig Dispense Refill  . aspirin EC 81 MG tablet Take 81 mg by  mouth daily.    Marland Kitchen ezetimibe (ZETIA) 10 MG tablet Take 10 mg by mouth daily.    Marland Kitchen levothyroxine (SYNTHROID) 137 MCG tablet Take 1 tablet by mouth daily.    Marland Kitchen lisinopril (PRINIVIL,ZESTRIL) 5 MG tablet Take 5 mg by mouth daily.    . metoprolol tartrate (LOPRESSOR) 25 MG tablet Take 12.5 mg by mouth 2 (two) times daily.     . nitroGLYCERIN (NITROSTAT) 0.4 MG SL tablet Place 1 tablet (0.4 mg total) under the tongue every 5 (five) minutes as needed for chest pain. 25 tablet 3  . rosuvastatin (CRESTOR) 40 MG tablet 1  tablet    . tamsulosin (FLOMAX) 0.4 MG CAPS capsule Take 1 capsule (0.4 mg total) by mouth 2 (two) times daily. 60 capsule 5   No current facility-administered medications for this visit.    No Known Allergies  Social History   Socioeconomic History  . Marital status: Single    Spouse name: Not on file  . Number of children: 1  . Years of education: Not on file  . Highest education level: Not on file  Occupational History  . Occupation: Air cabin crew for [employer]  Tobacco Use  . Smoking status: Current Every Day Smoker    Packs/day: 0.75    Years: 55.00    Pack years: 41.25    Types: Cigarettes  . Smokeless tobacco: Never Used  Vaping Use  . Vaping Use: Never used  Substance and Sexual Activity  . Alcohol use: Yes    Alcohol/week: 10.0 standard drinks    Types: 10 Cans of beer per week    Comment: 2-6 beers per day  . Drug use: No  . Sexual activity: Yes  Other Topics Concern  . 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   Moved to [place] from [place], [place] in 6.2018   Engaged   1 son -32 yo (Master's Degree candidate in Engineer, mining at [employer])   Social Determinants of Health   Financial Resource Strain: Not on file  Food Insecurity: Not on file  Transportation Needs: Not on file  Physical Activity: Not on file  Stress: Not on file  Social Connections: Not on file  Intimate Partner Violence: Not on file    Family History  Problem Relation Age of Onset  . Cancer Brother        prostate  . Alzheimer's disease Mother   . CAD Father        CABG  . Dementia Father   . Heart attack Paternal Uncle     Review of Systems:  As stated in the HPI and otherwise negative.   BP 136/70   Pulse 73   Ht 5\' 7"  (1.702 m)   Wt 208 lb 3.2 oz (94.4 kg)   SpO2 97%   BMI 32.61 kg/m   Physical Examination: General: Well developed, well nourished, NAD  HEENT: OP clear, mucus membranes moist  SKIN: warm, dry.  No rashes. Neuro: No focal deficits  Musculoskeletal: Muscle strength 5/5 all ext  Psychiatric: Mood and affect normal  Neck: No JVD, no carotid bruits, no thyromegaly, no lymphadenopathy.  Lungs:Clear bilaterally, no wheezes, rhonci, crackles Cardiovascular: Regular rate and rhythm. Loud, harsh, late peaking systolic murmur.  Abdomen:Soft. Bowel sounds present. Non-tender.  Extremities: No lower extremity edema. Pulses are 2 + in the bilateral DP/PT.  EKG:  EKG is ordered today. The ekg ordered today demonstrates sinus  Echo 02/28/21: 1.  The aortic valve is calcified. There is moderate-to-severe  calcification of the aortic valve. There is moderate-to-severe thickening  of the aortic valve. Aortic valve regurgitation is trivial. There is  moderate-to-severe aortic valve stenosis with  AVA 0.91cm2 by continuity, mean gradient 44mmHg, peak gradient 102mmHg,  Vmax 3.52m/s, DI 0.25. If clinically indicated/concerned for symptomatic  aortic stenosis, would recommend evaluation by structural team.  2. Left ventricular ejection fraction, by estimation, is 65 to 70%. The  left ventricle has normal function. The left ventricle has no regional  wall motion abnormalities. Left ventricular diastolic parameters are  indeterminate.  3. Right ventricular systolic function is normal. The right ventricular  size is normal. Tricuspid regurgitation signal is inadequate for assessing  PA pressure.  4. The mitral valve is normal in structure. Trivial mitral valve  regurgitation.  5. Aortic dilatation noted. There is mild dilatation of the ascending  aorta, measuring 36 mm.  6. The inferior vena cava is normal in size with greater than 50%  respiratory variability, suggesting right atrial pressure of 3 mmHg.   FINDINGS  Left Ventricle: Left ventricular ejection fraction, by estimation, is 65  to 70%. The left ventricle has normal function. The left ventricle has no  regional wall motion  abnormalities. The left ventricular internal cavity  size was normal in size. There is  borderline concentric left ventricular hypertrophy. Left ventricular  diastolic parameters are indeterminate.   Right Ventricle: The right ventricular size is normal. No increase in  right ventricular wall thickness. Right ventricular systolic function is  normal. Tricuspid regurgitation signal is inadequate for assessing PA  pressure.   Left Atrium: Left atrial size was normal in size.   Right Atrium: Right atrial size was normal in size.   Pericardium: There is no evidence of pericardial effusion.   Mitral Valve: The mitral valve is normal in structure. Trivial mitral  valve regurgitation.   Tricuspid Valve: The tricuspid valve is normal in structure. Tricuspid  valve regurgitation is not demonstrated.   Aortic Valve: The aortic valve is calcified. There is moderate  calcification of the aortic valve. There is moderate thickening of the  aortic valve. Aortic valve regurgitation is trivial. Aortic regurgitation  PHT measures 440 msec. Moderate to severe  aortic stenosis is present.AVA 0.91cm2, mean gradient 109mmHg, peak  gradient 37mmHg, Vmax 3.49m/s, DI 0.25   Pulmonic Valve: The pulmonic valve was normal in structure. Pulmonic valve  regurgitation is trivial.   Aorta: Aortic dilatation noted. There is mild dilatation of the ascending  aorta, measuring 36 mm.   Venous: The inferior vena cava is normal in size with greater than 50%  respiratory variability, suggesting right atrial pressure of 3 mmHg.   IAS/Shunts: No atrial level shunt detected by color flow Doppler.     LEFT VENTRICLE  PLAX 2D  LVIDd:     4.40 cm Diastology  LVIDs:     2.50 cm LV e' medial:  8.08 cm/s  LV PW:     1.00 cm LV E/e' medial: 11.8  LV IVS:    1.00 cm LV e' lateral:  10.40 cm/s  LVOT diam:   2.10 cm LV E/e' lateral: 9.1  LV SV:     67  LV SV Index:  33  LVOT Area:    3.46 cm     RIGHT VENTRICLE       IVC  RV Basal diam: 4.20 cm   IVC diam: 1.60 cm  RV S prime:   19.50 cm/s  TAPSE (M-mode): 2.2 cm   LEFT ATRIUM       Index    RIGHT ATRIUM      Index  LA diam:    3.90 cm 1.89 cm/m RA Area:   14.70 cm  LA Vol (A2C):  55.5 ml 26.95 ml/m RA Volume:  37.60 ml 18.26 ml/m  LA Vol (A4C):  37.6 ml 18.26 ml/m  LA Biplane Vol: 46.7 ml 22.67 ml/m  AORTIC VALVE  AV Area (Vmax):  0.93 cm  AV Area (Vmean):  0.87 cm  AV Area (VTI):   0.94 cm  AV Vmax:      372.60 cm/s  AV Vmean:     263.800 cm/s  AV VTI:      0.718 m  AV Peak Grad:   55.5 mmHg  AV Mean Grad:   31.8 mmHg  LVOT Vmax:     100.25 cm/s  LVOT Vmean:    66.200 cm/s  LVOT VTI:     0.194 m  LVOT/AV VTI ratio: 0.27  AI PHT:      440 msec    AORTA  Ao Root diam: 3.40 cm  Ao Asc diam: 3.60 cm   MITRAL VALVE  MV Area (PHT): 3.17 cm  SHUNTS  MV Decel Time: 239 msec  Systemic VTI: 0.19 m  MV E velocity: 95.10 cm/s Systemic Diam: 2.10 cm  MV A velocity: 79.50 cm/s  MV E/A ratio: 1.20   Recent Labs: No results found for requested labs within last 8760 hours.   Lipid Panel No results found for: CHOL, TRIG, HDL, CHOLHDL, VLDL, LDLCALC, LDLDIRECT   Wt Readings from Last 3 Encounters:  03/18/21 208 lb 3.2 oz (94.4 kg)  01/23/21 208 lb 12.8 oz (94.7 kg)  10/31/19 217 lb (98.4 kg)     Other studies Reviewed: Additional studies/ records that were reviewed today include: echo images, office notes Review of the above records demonstrates: severe AS   Assessment and Plan:   1. Severe Aortic Valve Stenosis: He likely has severe, stage D3 aortic valve stenosis. I have personally reviewed the echo images. The aortic valve is thickened, calcified with limited leaflet mobility. I think he would benefit from AVR. Given advanced age, he is not a good candidate for conventional AVR by surgical approach.  I think he may be a good candidate for TAVR. Will get additional information about severity of AS from cath and CT scans.   STS Risk Score: AVR + CABG Risk of Mortality: 0.931% Renal Failure: 1.293% Permanent Stroke: 1.056% Prolonged Ventilation: 5.483% DSW Infection: 0.237% Reoperation: 2.899% Morbidity or Mortality: 9.079% Short Length of Stay: 57.568% Long Length of Stay: 3.121%   I have reviewed the natural history of aortic stenosis with the patient and their family members  who are present today. We have discussed the limitations of medical therapy and the poor prognosis associated with symptomatic aortic stenosis. We have reviewed potential treatment options, including palliative medical therapy, conventional surgical aortic valve replacement, and transcatheter aortic valve replacement. We discussed treatment options in the context of the patient's specific comorbid medical conditions.   He would like to proceed with planning for TAVR. I will arrange a right and left heart catheterization at [facility] 04/01/21 at 7:30 am.  Risks and benefits of the cath procedure and the valve procedure are reviewed with the patient. After the cath, he will have a cardiac CT, CTA of the chest/abdomen and pelvis, carotid artery dopplers, PT assessment and will then be referred to see  one of the CT surgeons on our TAVR team.      Current medicines are reviewed at length with the patient today.  The patient does not have concerns regarding medicines.  The following changes have been made:  no change  Labs/ tests ordered today include:   Orders Placed This Encounter  Procedures  . CBC  . Basic metabolic panel  . EKG 12-Lead     Disposition:   F/U with the valve team.    Signed, Lauree Chandler, MD 03/18/2021 1:19 PM    [address], [place], [place]  [address] Phone: [phone]; Fax: [phone]

## 2021-03-19 LAB — BASIC METABOLIC PANEL
BUN/Creatinine Ratio: 15 (ref 10–24)
BUN: 15 mg/dL (ref 8–27)
CO2: 23 mmol/L (ref 20–29)
Calcium: 9.9 mg/dL (ref 8.6–10.2)
Chloride: 106 mmol/L (ref 96–106)
Creatinine, Ser: 0.99 mg/dL (ref 0.76–1.27)
Glucose: 79 mg/dL (ref 65–99)
Potassium: 4.8 mmol/L (ref 3.5–5.2)
Sodium: 144 mmol/L (ref 134–144)
eGFR: 82 mL/min/{1.73_m2} (ref >59)

## 2021-03-19 LAB — CBC
Hematocrit: 43.5 % (ref 37.5–51.0)
Hemoglobin: 14.9 g/dL (ref 13.0–17.7)
MCH: 31.5 pg (ref 26.6–33.0)
MCHC: 34.3 g/dL (ref 31.5–35.7)
MCV: 92 fL (ref 79–97)
Platelets: 219 10*3/uL (ref 150–450)
RBC: 4.73 x10E6/uL (ref 4.14–5.80)
RDW: 12.3 % (ref 11.6–15.4)
WBC: 6.8 10*3/uL (ref 3.4–10.8)

## 2021-03-21 ENCOUNTER — Encounter (HOSPITAL_COMMUNITY): Payer: Medicare Other

## 2021-03-28 ENCOUNTER — Telehealth: Payer: Self-pay | Admitting: *Deleted

## 2021-03-28 NOTE — Telephone Encounter (Signed)
Pt contacted pre-catheterization scheduled at [facility] for: Monday April 01, 2021 7:30 AM Verified arrival time and place: [facility]) at: 5:30 AM   No solid food after midnight prior to cath, clear liquids until 5 AM day of procedure.   AM meds can be  taken pre-cath with sips of water including: ASA 81 mg   Confirmed patient has responsible adult to drive home post procedure and be with patient first 24 hours after arriving home: yes  You are allowed ONE visitor in the waiting room during the time you are at the hospital for your procedure. Both you and your visitor must wear a mask once you enter the hospital.   Patient reports does not currently have any symptoms concerning for COVID-19 and no household members with COVID-19 like illness.      Reviewed procedure/mask/visitor instructions with patient.

## 2021-04-01 ENCOUNTER — Encounter (HOSPITAL_COMMUNITY)
Admission: RE | Disposition: A | Discharge status: Home / Self Care | Payer: Self-pay | Source: Home / Self Care | Attending: Cardiovascular Disease

## 2021-04-01 ENCOUNTER — Other Ambulatory Visit: Payer: Self-pay

## 2021-04-01 ENCOUNTER — Ambulatory Visit (HOSPITAL_COMMUNITY)
Admission: RE | Admit: 2021-04-01 | Discharge: 2021-04-01 | Disposition: A | Discharge status: Home / Self Care | Payer: Medicare Other | Attending: Cardiovascular Disease | Admitting: Cardiovascular Disease

## 2021-04-01 ENCOUNTER — Encounter (HOSPITAL_COMMUNITY): Payer: Self-pay | Admitting: Cardiovascular Disease

## 2021-04-01 DIAGNOSIS — Z79890 Hormone replacement therapy (postmenopausal): Secondary | ICD-10-CM | POA: Diagnosis not present

## 2021-04-01 DIAGNOSIS — E785 Hyperlipidemia, unspecified: Secondary | ICD-10-CM | POA: Diagnosis not present

## 2021-04-01 DIAGNOSIS — I352 Nonrheumatic aortic (valve) stenosis with insufficiency: Secondary | ICD-10-CM | POA: Insufficient documentation

## 2021-04-01 DIAGNOSIS — F17210 Nicotine dependence, cigarettes, uncomplicated: Secondary | ICD-10-CM | POA: Insufficient documentation

## 2021-04-01 DIAGNOSIS — Z7982 Long term (current) use of aspirin: Secondary | ICD-10-CM | POA: Diagnosis not present

## 2021-04-01 DIAGNOSIS — Z79899 Other long term (current) drug therapy: Secondary | ICD-10-CM | POA: Insufficient documentation

## 2021-04-01 DIAGNOSIS — I350 Nonrheumatic aortic (valve) stenosis: Secondary | ICD-10-CM

## 2021-04-01 DIAGNOSIS — I2510 Atherosclerotic heart disease of native coronary artery without angina pectoris: Secondary | ICD-10-CM | POA: Insufficient documentation

## 2021-04-01 DIAGNOSIS — E039 Hypothyroidism, unspecified: Secondary | ICD-10-CM | POA: Diagnosis not present

## 2021-04-01 HISTORY — PX: RIGHT/LEFT HEART CATH AND CORONARY ANGIOGRAPHY: CATH118266

## 2021-04-01 LAB — POCT I-STAT EG7
Acid-base deficit: 1 mmol/L (ref 0.0–2.0)
Bicarbonate: 24.2 mmol/L (ref 20.0–28.0)
Calcium, Ion: 1.03 mmol/L — ABNORMAL LOW (ref 1.15–1.40)
HCT: 36 % — ABNORMAL LOW (ref 39.0–52.0)
Hemoglobin: 12.2 g/dL — ABNORMAL LOW (ref 13.0–17.0)
O2 Saturation: 71 %
Potassium: 3.6 mmol/L (ref 3.5–5.1)
Sodium: 146 mmol/L — ABNORMAL HIGH (ref 135–145)
TCO2: 25 mmol/L (ref 22–32)
pCO2, Ven: 43.1 mmHg — ABNORMAL LOW (ref 44.0–60.0)
pH, Ven: 7.358 (ref 7.250–7.430)
pO2, Ven: 39 mmHg (ref 32.0–45.0)

## 2021-04-01 LAB — POCT I-STAT 7, (LYTES, BLD GAS, ICA,H+H)
Acid-base deficit: 1 mmol/L (ref 0.0–2.0)
Bicarbonate: 24.6 mmol/L (ref 20.0–28.0)
Calcium, Ion: 1.21 mmol/L (ref 1.15–1.40)
HCT: 39 % (ref 39.0–52.0)
Hemoglobin: 13.3 g/dL (ref 13.0–17.0)
O2 Saturation: 98 %
Potassium: 4.1 mmol/L (ref 3.5–5.1)
Sodium: 142 mmol/L (ref 135–145)
TCO2: 26 mmol/L (ref 22–32)
pCO2 arterial: 41.5 mmHg (ref 32.0–48.0)
pH, Arterial: 7.38 (ref 7.350–7.450)
pO2, Arterial: 103 mmHg (ref 83.0–108.0)

## 2021-04-01 SURGERY — RIGHT/LEFT HEART CATH AND CORONARY ANGIOGRAPHY
Anesthesia: LOCAL

## 2021-04-01 MED ORDER — FENTANYL CITRATE (PF) 100 MCG/2ML IJ SOLN
INTRAMUSCULAR | Status: AC
  Filled 2021-04-01: qty 2

## 2021-04-01 MED ORDER — LIDOCAINE HCL (PF) 1 % IJ SOLN
INTRAMUSCULAR | Status: AC
  Filled 2021-04-01: qty 30

## 2021-04-01 MED ORDER — SODIUM CHLORIDE 0.9 % WEIGHT BASED INFUSION: 1.0000 mL/kg/h | INTRAVENOUS | Status: DC

## 2021-04-01 MED ORDER — SODIUM CHLORIDE 0.9% FLUSH: 3.0000 mL | Freq: Two times a day (BID) | INTRAVENOUS | Status: DC

## 2021-04-01 MED ORDER — SODIUM CHLORIDE 0.9 % IV SOLN: 250.0000 mL | INTRAVENOUS | Status: DC | PRN

## 2021-04-01 MED ORDER — ACETAMINOPHEN 325 MG PO TABS: 650.0000 mg | ORAL_TABLET | ORAL | Status: DC | PRN

## 2021-04-01 MED ORDER — MIDAZOLAM HCL 2 MG/2ML IJ SOLN
INTRAMUSCULAR | Status: DC | PRN
  Administered 2021-04-01: 2 mg via INTRAVENOUS

## 2021-04-01 MED ORDER — ASPIRIN 81 MG PO CHEW: 81.0000 mg | CHEWABLE_TABLET | ORAL | Status: DC

## 2021-04-01 MED ORDER — LIDOCAINE HCL (PF) 1 % IJ SOLN
INTRAMUSCULAR | Status: DC | PRN
  Administered 2021-04-01: 2 mL

## 2021-04-01 MED ORDER — SODIUM CHLORIDE 0.9% FLUSH: 3.0000 mL | INTRAVENOUS | Status: DC | PRN

## 2021-04-01 MED ORDER — LABETALOL HCL 5 MG/ML IV SOLN: 10.0000 mg | INTRAVENOUS | Status: DC | PRN

## 2021-04-01 MED ORDER — HEPARIN (PORCINE) IN NACL 1000-0.9 UT/500ML-% IV SOLN
INTRAVENOUS | Status: AC
  Filled 2021-04-01: qty 1000

## 2021-04-01 MED ORDER — SODIUM CHLORIDE 0.9 % IV SOLN: INTRAVENOUS | Status: AC

## 2021-04-01 MED ORDER — HEPARIN SODIUM (PORCINE) 1000 UNIT/ML IJ SOLN
INTRAMUSCULAR | Status: AC
  Filled 2021-04-01: qty 1

## 2021-04-01 MED ORDER — HYDRALAZINE HCL 20 MG/ML IJ SOLN: 10.0000 mg | INTRAMUSCULAR | Status: DC | PRN

## 2021-04-01 MED ORDER — IOHEXOL 350 MG/ML SOLN
INTRAVENOUS | Status: DC | PRN
  Administered 2021-04-01: 50 mL

## 2021-04-01 MED ORDER — HEPARIN (PORCINE) IN NACL 1000-0.9 UT/500ML-% IV SOLN
INTRAVENOUS | Status: DC | PRN
  Administered 2021-04-01 (×2): 500 mL

## 2021-04-01 MED ORDER — SODIUM CHLORIDE 0.9 % WEIGHT BASED INFUSION
3.0000 mL/kg/h | INTRAVENOUS | Status: AC
Start: 2021-04-01 — End: 2021-04-01
  Administered 2021-04-01: 3 mL/kg/h via INTRAVENOUS

## 2021-04-01 MED ORDER — FENTANYL CITRATE (PF) 100 MCG/2ML IJ SOLN
INTRAMUSCULAR | Status: DC | PRN
  Administered 2021-04-01: 50 ug via INTRAVENOUS

## 2021-04-01 MED ORDER — VERAPAMIL HCL 2.5 MG/ML IV SOLN
INTRAVENOUS | Status: AC
  Filled 2021-04-01: qty 2

## 2021-04-01 MED ORDER — VERAPAMIL HCL 2.5 MG/ML IV SOLN
INTRAVENOUS | Status: DC | PRN
  Administered 2021-04-01: 10 mL via INTRA_ARTERIAL

## 2021-04-01 MED ORDER — HEPARIN SODIUM (PORCINE) 1000 UNIT/ML IJ SOLN
INTRAMUSCULAR | Status: DC | PRN
  Administered 2021-04-01: 5000 [IU] via INTRAVENOUS

## 2021-04-01 MED ORDER — ONDANSETRON HCL 4 MG/2ML IJ SOLN: 4.0000 mg | Freq: Four times a day (QID) | INTRAMUSCULAR | Status: DC | PRN

## 2021-04-01 MED ORDER — MIDAZOLAM HCL 2 MG/2ML IJ SOLN
INTRAMUSCULAR | Status: AC
  Filled 2021-04-01: qty 2

## 2021-04-01 SURGICAL SUPPLY — 12 items
CATH 5FR JL3.5 JR4 ANG PIG MP (CATHETERS) ×2 IMPLANT
CATH SWAN DBL LUMAN 5F 110 (CATHETERS) ×2 IMPLANT
DEVICE RAD COMP TR BAND LRG (VASCULAR PRODUCTS) ×2 IMPLANT
GLIDESHEATH SLEND SS 6F .021 (SHEATH) ×2 IMPLANT
GUIDEWIRE INQWIRE 1.5J.035X260 (WIRE) ×1 IMPLANT
INQWIRE 1.5J .035X260CM (WIRE) ×2
KIT HEART LEFT (KITS) ×2 IMPLANT
PACK CARDIAC CATHETERIZATION (CUSTOM PROCEDURE TRAY) ×2 IMPLANT
SHEATH GLIDE SLENDER 4/5FR (SHEATH) ×2 IMPLANT
SHEATH PROBE COVER 6X72 (BAG) ×2 IMPLANT
TRANSDUCER W/STOPCOCK (MISCELLANEOUS) ×2 IMPLANT
TUBING CIL FLEX 10 FLL-RA (TUBING) ×2 IMPLANT

## 2021-04-01 NOTE — Progress Notes (Signed)
Pt ambulated without difficulty or bleeding.   Discharged home with his wife who will drive and stay with pt x 24 hrs. 

## 2021-04-01 NOTE — Interval H&P Note (Signed)
History and Physical Interval Note:  04/01/2021 7:33 AM  Oscar Bailey  has presented today for surgery, with the diagnosis of aortic stenosis.  The various methods of treatment have been discussed with the patient and family. After consideration of risks, benefits and other options for treatment, the patient has consented to  Procedure(s): RIGHT/LEFT HEART CATH AND CORONARY ANGIOGRAPHY (N/A) as a surgical intervention.  The patient's history has been reviewed, patient examined, no change in status, stable for surgery.  I have reviewed the patient's chart and labs.  Questions were answered to the patient's satisfaction.    Cath Lab Visit (complete for each Cath Lab visit)  Clinical Evaluation Leading to the Procedure:   ACS: No.  Non-ACS:    Anginal Classification: CCS II  Anti-ischemic medical therapy: Minimal Therapy (1 class of medications)  Non-Invasive Test Results: No non-invasive testing performed  Prior CABG: No previous CABG        Lauree Chandler

## 2021-04-01 NOTE — Discharge Instructions (Signed)
Radial Site Care  This sheet gives you information about how to care for yourself after your procedure. Your health care provider may also give you more specific instructions. If you have problems or questions, contact your health care provider. What can I expect after the procedure? After the procedure, it is common to have: Bruising and tenderness at the catheter insertion area. Follow these instructions at home: Medicines Take over-the-counter and prescription medicines only as told by your health care provider. Insertion site care Follow instructions from your health care provider about how to take care of your insertion site. Make sure you: Wash your hands with soap and water before you remove your bandage (dressing). If soap and water are not available, use hand sanitizer. May remove dressing in 24 hours. Check your insertion site every day for signs of infection. Check for: Redness, swelling, or pain. Fluid or blood. Pus or a bad smell. Warmth. Do no take baths, swim, or use a hot tub for 5 days. You may shower 24-48 hours after the procedure. Remove the dressing and gently wash the site with plain soap and water. Pat the area dry with a clean towel. Do not rub the site. That could cause bleeding. Do not apply powder or lotion to the site. Activity  For 24 hours after the procedure, or as directed by your health care provider: Do not flex or bend the affected arm. Do not push or pull heavy objects with the affected arm. Do not drive yourself home from the hospital or clinic. You may drive 24 hours after the procedure. Do not operate machinery or power tools. KEEP ARM ELEVATED THE REMAINDER OF THE DAY. Do not push, pull or lift anything that is heavier than 10 lb for 5 days. Ask your health care provider when it is okay to: Return to work or school. Resume usual physical activities or sports. Resume sexual activity. General instructions If the catheter site starts to  bleed, raise your arm and put firm pressure on the site. If the bleeding does not stop, get help right away. This is a medical emergency. DRINK PLENTY OF FLUIDS FOR THE NEXT 2-3 DAYS. No alcohol consumption for 24 hours after receiving sedation. If you went home on the same day as your procedure, a responsible adult should be with you for the first 24 hours after you arrive home. Keep all follow-up visits as told by your health care provider. This is important. Contact a health care provider if: You have a fever. You have redness, swelling, or yellow drainage around your insertion site. Get help right away if: You have unusual pain at the radial site. The catheter insertion area swells very fast. The insertion area is bleeding, and the bleeding does not stop when you hold steady pressure on the area. Your arm or hand becomes pale, cool, tingly, or numb. These symptoms may represent a serious problem that is an emergency. Do not wait to see if the symptoms will go away. Get medical help right away. Call your local emergency services (911 in the [place] Do not drive yourself to the hospital. Summary After the procedure, it is common to have bruising and tenderness at the site. Follow instructions from your health care provider about how to take care of your radial site wound. Check the wound every day for signs of infection.  This information is not intended to replace advice given to you by your health care provider. Make sure you discuss any questions you have with   your health care provider. Document Revised: 11/04/2017 Document Reviewed: 11/04/2017 Elsevier Patient Education  2020 [employer]  

## 2021-04-04 ENCOUNTER — Other Ambulatory Visit: Payer: Self-pay

## 2021-04-04 DIAGNOSIS — I359 Nonrheumatic aortic valve disorder, unspecified: Secondary | ICD-10-CM

## 2021-04-16 ENCOUNTER — Other Ambulatory Visit: Payer: Self-pay | Admitting: Physician Assistant

## 2021-04-16 ENCOUNTER — Ambulatory Visit (HOSPITAL_COMMUNITY)
Admission: RE | Admit: 2021-04-16 | Discharge: 2021-04-16 | Disposition: A | Discharge status: Home / Self Care | Payer: Medicare Other | Source: Ambulatory Visit | Attending: Cardiovascular Disease | Admitting: Cardiovascular Disease

## 2021-04-16 ENCOUNTER — Other Ambulatory Visit: Payer: Self-pay

## 2021-04-16 DIAGNOSIS — K5730 Diverticulosis of large intestine without perforation or abscess without bleeding: Secondary | ICD-10-CM | POA: Diagnosis not present

## 2021-04-16 DIAGNOSIS — I359 Nonrheumatic aortic valve disorder, unspecified: Secondary | ICD-10-CM

## 2021-04-16 DIAGNOSIS — I350 Nonrheumatic aortic (valve) stenosis: Secondary | ICD-10-CM | POA: Diagnosis not present

## 2021-04-16 DIAGNOSIS — I2510 Atherosclerotic heart disease of native coronary artery without angina pectoris: Secondary | ICD-10-CM | POA: Diagnosis not present

## 2021-04-16 DIAGNOSIS — Z01818 Encounter for other preprocedural examination: Secondary | ICD-10-CM | POA: Diagnosis not present

## 2021-04-16 MED ORDER — IOHEXOL 350 MG/ML SOLN
100.0000 mL | Freq: Once | INTRAVENOUS | Status: AC | PRN
  Administered 2021-04-16: 100 mL via INTRAVENOUS

## 2021-04-17 ENCOUNTER — Encounter: Payer: Self-pay | Admitting: Physician Assistant

## 2021-04-24 DIAGNOSIS — E7800 Pure hypercholesterolemia, unspecified: Secondary | ICD-10-CM | POA: Diagnosis not present

## 2021-04-24 DIAGNOSIS — I2510 Atherosclerotic heart disease of native coronary artery without angina pectoris: Secondary | ICD-10-CM | POA: Diagnosis not present

## 2021-04-24 DIAGNOSIS — I10 Essential (primary) hypertension: Secondary | ICD-10-CM | POA: Diagnosis not present

## 2021-04-24 DIAGNOSIS — I25119 Atherosclerotic heart disease of native coronary artery with unspecified angina pectoris: Secondary | ICD-10-CM | POA: Diagnosis not present

## 2021-04-24 DIAGNOSIS — E039 Hypothyroidism, unspecified: Secondary | ICD-10-CM | POA: Diagnosis not present

## 2021-05-01 DIAGNOSIS — E039 Hypothyroidism, unspecified: Secondary | ICD-10-CM | POA: Diagnosis not present

## 2021-05-13 ENCOUNTER — Ambulatory Visit: Payer: Medicare Other | Attending: Physician Assistant | Admitting: Physical Therapy

## 2021-05-13 ENCOUNTER — Encounter: Payer: Self-pay | Admitting: Physical Therapy

## 2021-05-13 ENCOUNTER — Other Ambulatory Visit: Payer: Self-pay

## 2021-05-13 ENCOUNTER — Ambulatory Visit (HOSPITAL_COMMUNITY)
Admission: RE | Admit: 2021-05-13 | Discharge: 2021-05-13 | Disposition: A | Discharge status: Home / Self Care | Payer: Medicare Other | Source: Ambulatory Visit | Attending: Physician Assistant | Admitting: Physician Assistant

## 2021-05-13 DIAGNOSIS — I359 Nonrheumatic aortic valve disorder, unspecified: Secondary | ICD-10-CM | POA: Insufficient documentation

## 2021-05-13 DIAGNOSIS — R293 Abnormal posture: Secondary | ICD-10-CM | POA: Insufficient documentation

## 2021-05-13 NOTE — Therapy (Signed)
[place], [place], [address] Phone: [phone]   Fax:  [phone]  Physical Therapy Evaluation  Patient Details  Name: Oscar Bailey MRN: [id number] Date of Birth: 08-28-1951 Referring Provider (PT): Eileen Stanford, [place]   Encounter Date: 05/13/2021   PT End of Session - 05/13/21 1100     Visit Number 1    Number of Visits 1    Date for PT Re-Evaluation 05/14/21    PT Start Time 1100    PT Stop Time 1129    PT Time Calculation (min) 29 min    Activity Tolerance Patient tolerated treatment well    Behavior During Therapy [facility] for tasks assessed/performed             Past Medical History:  Diagnosis Date   Aortic stenosis    Echo 10/18: EF 55-60, normal wall motion, grade 1 diastolic dysfunction, moderate aortic stenosis (mean 20, peak 35) // Echocardiogram 5/22: EF 65-70, mod to severe AS (mean 35 mmHg, Vmax 396 cm/s, DI 0.25), trivial AI, normal RVSF, trivial MR, mild dilation of ascending aorta (36 mm)   Arthritis    FINGER S    CAD (coronary artery disease) 2011   Tx with DES ([place], [place]); Dr. Ofilia Neas in [place], [place] // [facility] 5/11 [facility]): Mid LAD 30, proximal D1 80, RCA 50 >> PCI: 2.25 x 20 mm taxus DES to the D1   Heart murmur    History of echocardiogram    Echo 5/11 [facility]): EF 55   HLD (hyperlipidemia)    HTN (hypertension)    Hypothyroidism    hyperthyroidism >> s/p RAI treatment 2002   Malignant neoplasm of prostate ([place]) 06/01/2017   Myocardial infarction [facility]) 2011   STENT PLACED     Past Surgical History:  Procedure Laterality Date   CARDIAC CATHETERIZATION  2011   WITH STENT L DONE AT [facility] [place] N/A 09/24/2017   Procedure: CYSTOSCOPY;  Surgeon: Raynelle Bring, MD;  Location: [facility];  Service: Urology;  Laterality: N/A;   HERNIA REPAIR  2010   inguinal   LUMBAR LAMINECTOMY Right  2015   PROSTATE BIOPSY  04/2017   RADIOACTIVE SEED IMPLANT N/A 09/24/2017   Procedure: RADIOACTIVE SEED IMPLANT/BRACHYTHERAPY IMPLANT;  Surgeon: Raynelle Bring, MD;  Location: [facility];  Service: Urology;  Laterality: N/A;   RIGHT/LEFT HEART CATH AND CORONARY ANGIOGRAPHY N/A 04/01/2021   Procedure: RIGHT/LEFT HEART CATH AND CORONARY ANGIOGRAPHY;  Surgeon: Burnell Blanks, MD;  Location: [facility];  Service: Cardiovascular;  Laterality: N/A;   SPACE OAR INSTILLATION N/A 09/24/2017   Procedure: SPACE OAR INSTILLATION;  Surgeon: Raynelle Bring, MD;  Location: [facility];  Service: Urology;  Laterality: N/A;   TOTAL HIP ARTHROPLASTY Right 2013    There were no vitals filed for this visit.    Subjective Assessment - 05/13/21 1100     Subjective pt is a 70 y.o M with CC of increased fatigue and SOB that has been going on for about a year and notes it seems to be staying about the same since onset.    Patient Stated Goals to fix heart.    Currently in Pain? No/denies                [facility] PT Assessment - 05/13/21 1100       Assessment   Medical Diagnosis Severe Aortic Stenosis  Referring Provider (PT) Eileen Stanford, PA-C    Hand Dominance Right      Precautions   Precautions None      Restrictions   Weight Bearing Restrictions No      Balance Screen   Has the patient fallen in the past 6 months No    Has the patient had a decrease in activity level because of a fear of falling?  No      Home Environment   Living Environment Private residence    Living Arrangements Spouse/significant other    Available Help at Discharge Family    Type of [place] Access Level entry    [place] None      Posture/Postural Control   Posture/Postural Control Postural limitations    Postural Limitations Rounded Shoulders;Forward head      ROM / Strength   AROM / PROM / Strength AROM;Strength      AROM   Overall AROM  Within  functional limits for tasks performed      Strength   Overall Strength Within functional limits for tasks performed    Right Hand Grip (lbs) 107    Left Hand Grip (lbs) 104      Ambulation/Gait   Gait Pattern Step-through pattern              [facility] Pre-Surgical Assessment - 05/13/21 0001     5 Meter Walk Test- trial 1 5 sec    5 Meter Walk Test- trial 2 4 sec.     5 Meter Walk Test- trial 3 4 sec.    5 meter walk test average 4.33 sec    4 Stage Balance Test tolerated for:  10 sec.    4 Stage Balance Test Position 4    Sit To Stand Test- trial 1 9 sec.    ADL/IADL Independent with: Bathing;Dressing;Meal prep;Finances;Yard work    6 Minute Walk- Baseline yes    BP (mmHg) 148/71    HR (bpm) 72    02 Sat (%RA) 97 %    Modified Borg Scale for Dyspnea 0- Nothing at all    Perceived Rate of Exertion (Borg) 7- Very, very light    6 Minute Walk Post Test yes    BP (mmHg) 145/74    HR (bpm) 90    02 Sat (%RA) 96 %    Modified Borg Scale for Dyspnea 0.5- Very, very slight shortness of breath    Perceived Rate of Exertion (Borg) 9- very light    Aerobic Endurance Distance Walked 1075    Endurance additional comments pt is 42.70% limited copared to age related norm                      Objective measurements completed on examination: See above findings.                            Plan - 05/13/21 1109     Clinical Impression Statement see assessment in note    Stability/Clinical Decision Making Stable/Uncomplicated    Clinical Decision Making Low    PT Frequency One time visit    PT Next Visit Plan PRE TAVR    Consulted and Agree with Plan of Care Patient             Clinical Impression Statement: Pt is a 70 yo M presenting to OP  PT for evaluation prior to possible TAVR surgery due to severe aortic stenosis. Pt reports onset of SOB and general fatigue approximately 12 months ago. Symptoms are limiting prolonged standing/ walking. Pt  presents with good ROM and strength, good balance and is assess as low at high fall risk 4 stage balance test, good walking speed and fair aerobic endurance per 6 minute walk test. Pt ambulated 1075 feet in  without requesting a seated rest beak lasting. At end  of test, patient's HR was 90 bpm and O2 was 96% on room air. Pt reported 0.5/10 shortness of breath on modified scale for dyspnea.  Pt ambulated a total of 1075 feet in 6 minute walk. SOB increased with 6 minute walk test. Based on the Short Physical Performance Battery, patient has a frailty rating of 12/12 with </= 5/12 considered frail.    Patient demonstrated the following deficits and impairments:     Visit Diagnosis: Abnormal posture     Problem List Patient Active Problem List   Diagnosis Date Noted   Aortic stenosis    HTN (hypertension)    HLD (hyperlipidemia)    Malignant neoplasm of prostate ([place]) 06/01/2017   CAD (coronary artery disease) 10/13/2009   Starr [place] PT, DPT, LAT, ATC  05/13/21  11:32 AM     [facility] [address] [place], [place], [address] Phone: [phone]   Fax:  [phone]  Name: Oscar Bailey MRN: [id number] Date of Birth: 06-03-1951

## 2021-05-13 NOTE — Progress Notes (Signed)
Carotid duplex has been completed.   Preliminary results in CV Proc.   Abram Sander 05/13/2021 10:06 AM

## 2021-05-13 NOTE — Addendum Note (Signed)
Addended by: Larey Days on: 05/13/2021 11:33 AM   Modules accepted: Orders

## 2021-05-29 ENCOUNTER — Encounter: Payer: Medicare Other | Admitting: Surgery

## 2021-05-29 DIAGNOSIS — E7800 Pure hypercholesterolemia, unspecified: Secondary | ICD-10-CM | POA: Diagnosis not present

## 2021-05-29 DIAGNOSIS — I10 Essential (primary) hypertension: Secondary | ICD-10-CM | POA: Diagnosis not present

## 2021-05-29 DIAGNOSIS — E039 Hypothyroidism, unspecified: Secondary | ICD-10-CM | POA: Diagnosis not present

## 2021-05-29 DIAGNOSIS — I25119 Atherosclerotic heart disease of native coronary artery with unspecified angina pectoris: Secondary | ICD-10-CM | POA: Diagnosis not present

## 2021-05-29 DIAGNOSIS — I2510 Atherosclerotic heart disease of native coronary artery without angina pectoris: Secondary | ICD-10-CM | POA: Diagnosis not present

## 2021-06-24 DIAGNOSIS — I25119 Atherosclerotic heart disease of native coronary artery with unspecified angina pectoris: Secondary | ICD-10-CM | POA: Diagnosis not present

## 2021-06-24 DIAGNOSIS — I2510 Atherosclerotic heart disease of native coronary artery without angina pectoris: Secondary | ICD-10-CM | POA: Diagnosis not present

## 2021-06-24 DIAGNOSIS — E039 Hypothyroidism, unspecified: Secondary | ICD-10-CM | POA: Diagnosis not present

## 2021-06-24 DIAGNOSIS — I10 Essential (primary) hypertension: Secondary | ICD-10-CM | POA: Diagnosis not present

## 2021-06-24 DIAGNOSIS — E7800 Pure hypercholesterolemia, unspecified: Secondary | ICD-10-CM | POA: Diagnosis not present

## 2021-07-05 DIAGNOSIS — E039 Hypothyroidism, unspecified: Secondary | ICD-10-CM | POA: Diagnosis not present

## 2021-07-10 ENCOUNTER — Encounter: Payer: Self-pay | Admitting: Surgery

## 2021-07-10 ENCOUNTER — Institutional Professional Consult (permissible substitution): Payer: Medicare Other | Admitting: Surgery

## 2021-07-10 ENCOUNTER — Encounter: Payer: Self-pay | Admitting: Physician Assistant

## 2021-07-10 ENCOUNTER — Other Ambulatory Visit: Payer: Self-pay

## 2021-07-10 ENCOUNTER — Other Ambulatory Visit: Payer: Self-pay | Admitting: Physician Assistant

## 2021-07-10 VITALS — BP 140/70 | HR 70 | Resp 20 | Ht 67.0 in | Wt 205.0 lb

## 2021-07-10 DIAGNOSIS — I350 Nonrheumatic aortic (valve) stenosis: Secondary | ICD-10-CM | POA: Diagnosis not present

## 2021-07-10 NOTE — Progress Notes (Signed)
Patient ID: Oscar Bailey, male   DOB: 07-17-1951, 70 y.o.   MRN: [id number]  [facility]         [address]       [place] [address]             [phone]          CARDIOTHORACIC SURGERY CONSULTATION REPORT  PCP is Lois Huxley, PA Referring Provider is Lauree Chandler, MD Primary Cardiologist is Mertie Moores, MD  Reason for consultation: Severe aortic stenosis  HPI:  The patient is a 70 year old gentleman with a history of hypertension, hyperlipidemia, hypothyroidism, coronary artery disease status post PCI and stenting of a diagonal branch in 2011, prostate cancer treated with radiation seeds, and moderate aortic stenosis followed by echocardiogram.  2D echo in October 2018 showed a trileaflet moderately calcified aortic valve with a mean gradient of 20 mmHg and a dimensionless index of 0.28.  Left ventricular ejection fraction was 55 to 60%.  He was seen back for cardiology follow-up in May 2022 and was reporting exertional fatigue and chest discomfort.  A follow-up echo on 02/28/2021 showed an ejection fraction of 65 to 70%.  The mean gradient across aortic valve had increased to 35 mmHg with a peak gradient of 63 mmHg.  Aortic valve area was 0.87 cm with a dimensionless index of 0.27.  Stroke-volume index was 33.  He underwent cardiac catheterization on 04/01/2021 showing a patent diagonal stent and otherwise mild nonobstructive coronary disease.  The mean gradient across aortic valve was measured at 23 mmHg with a peak gradient of 25 mmHg.  A gated cardiac CTA was performed showing a severely calcified aortic valve with restricted leaflet mobility.  There was severe bulky calcification in the right coronary cusp.  He reports having exertional fatigue and shortness of breath over the past several months.  He denies any chest discomfort.  He has had no dizziness or syncope.  Denies orthopnea and PND.  He  has had no peripheral edema.  He lives in [place] and moved here 4 years ago from [place] where he retired from a [employer].  He does go to the dentist regularly and has had no problems.  Past Medical History:  Diagnosis Date   Aortic stenosis    Echo 10/18: EF 55-60, normal wall motion, grade 1 diastolic dysfunction, moderate aortic stenosis (mean 20, peak 35) // Echocardiogram 5/22: EF 65-70, mod to severe AS (mean 35 mmHg, Vmax 396 cm/s, DI 0.25), trivial AI, normal RVSF, trivial MR, mild dilation of ascending aorta (36 mm)   Arthritis    FINGER S    CAD (coronary artery disease) 2011   Tx with DES ([place], [place]); Dr. Ofilia Neas in [place], [place] // [facility] 5/11 [facility]): Mid LAD 30, proximal D1 80, RCA 50 >> PCI: 2.25 x 20 mm taxus DES to the D1   Heart murmur    History of echocardiogram    Echo 5/11 [facility]): EF 55   HLD (hyperlipidemia)    HTN (hypertension)    Hypothyroidism    hyperthyroidism >> s/p RAI treatment 2002   Malignant neoplasm of prostate ([place]) 06/01/2017   Myocardial infarction [facility]) 2011   STENT PLACED     Past Surgical History:  Procedure Laterality Date   CARDIAC CATHETERIZATION  2011   WITH STENT L DONE AT [facility] [place]  N/A 09/24/2017   Procedure: CYSTOSCOPY;  Surgeon: Raynelle Bring, MD;  Location: [facility];  Service: Urology;  Laterality: N/A;   HERNIA REPAIR  2010   inguinal   LUMBAR LAMINECTOMY Right 2015   PROSTATE BIOPSY  04/2017   RADIOACTIVE SEED IMPLANT N/A 09/24/2017   Procedure: RADIOACTIVE SEED IMPLANT/BRACHYTHERAPY IMPLANT;  Surgeon: Raynelle Bring, MD;  Location: [facility];  Service: Urology;  Laterality: N/A;   RIGHT/LEFT HEART CATH AND CORONARY ANGIOGRAPHY N/A 04/01/2021   Procedure: RIGHT/LEFT HEART CATH AND CORONARY ANGIOGRAPHY;  Surgeon: Burnell Blanks, MD;  Location: [place] CV [facility];  Service: Cardiovascular;  Laterality: N/A;   SPACE OAR  INSTILLATION N/A 09/24/2017   Procedure: SPACE OAR INSTILLATION;  Surgeon: Raynelle Bring, MD;  Location: [facility];  Service: Urology;  Laterality: N/A;   TOTAL HIP ARTHROPLASTY Right 2013    Family History  Problem Relation Age of Onset   Cancer Brother        prostate   Alzheimer's disease Mother    CAD Father        CABG   Dementia Father    Heart attack Paternal Uncle     Social History   Socioeconomic History   Marital status: Single    Spouse name: Not on file   Number of children: 1   Years of education: Not on file   Highest education level: Not on file  Occupational History   Occupation: Air cabin crew for [employer]  Tobacco Use   Smoking status: Every Day    Packs/day: 0.75    Years: 55.00    Pack years: 41.25    Types: Cigarettes   Smokeless tobacco: Never  Vaping Use   Vaping Use: Never used  Substance and Sexual Activity   Alcohol use: Yes    Alcohol/week: 10.0 standard drinks    Types: 10 Cans of beer per week    Comment: 2-6 beers per day   Drug use: No   Sexual activity: Yes  Other Topics Concern   Not on file  Social History Narrative   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   Moved to [place] from [place], [place] in 6.2018   Engaged   1 son -52 yo (Master's Degree candidate in Engineer, mining at [employer])   Social Determinants of Health   Financial Resource Strain: Not on file  Food Insecurity: Not on file  Transportation Needs: Not on file  Physical Activity: Not on file  Stress: Not on file  Social Connections: Not on file  Intimate Partner Violence: Not on file    Prior to Admission medications   Medication Sig Start Date End Date Taking? Authorizing Provider  Ascorbic Acid (VITAMIN C) 250 MG CHEW Chew 250 mg by mouth daily.   Yes [provider]  aspirin EC 81 MG tablet Take 81 mg by mouth daily.   Yes [provider]  Cholecalciferol (VITAMIN D3) 50 MCG (2000 UT) CHEW Chew 2,000 Units by mouth daily.    Yes [provider]  ezetimibe (ZETIA) 10 MG tablet Take 10 mg by mouth daily. 12/09/20  Yes [provider]  fluticasone (FLONASE) 50 MCG/ACT nasal spray Place 2 sprays into both nostrils daily as needed for allergies or rhinitis.   Yes [provider]  levothyroxine (SYNTHROID) 125 MCG tablet Take 125 mcg by mouth daily before breakfast.   Yes [provider]  lisinopril (PRINIVIL,ZESTRIL) 5 MG tablet Take 5 mg by mouth daily.   Yes [provider]  metoprolol tartrate (LOPRESSOR) 25 MG tablet Take 12.5 mg by mouth 2 (two) times daily.    Yes [provider]  Polyethylene Glycol 400 (BLINK TEARS) 0.25 % SOLN Place 1 drop into both eyes 2 (two) times daily as needed (dry eyes).   Yes [provider]  rosuvastatin (CRESTOR) 40 MG tablet Take 40 mg by mouth daily. 12/14/19  Yes [provider]  tamsulosin (FLOMAX) 0.4 MG CAPS capsule Take 1 capsule (0.4 mg total) by mouth 2 (two) times daily. Patient taking differently: Take 0.4 mg by mouth daily. 10/12/17  Yes Tyler Pita, MD  nitroGLYCERIN (NITROSTAT) 0.4 MG SL tablet Place 1 tablet (0.4 mg total) under the tongue every 5 (five) minutes as needed for chest pain. Patient not taking: Reported on 07/10/2021 10/31/19   Nahser, Wonda Cheng, MD    Current Outpatient Medications  Medication Sig Dispense Refill   Ascorbic Acid (VITAMIN C) 250 MG CHEW Chew 250 mg by mouth daily.     aspirin EC 81 MG tablet Take 81 mg by mouth daily.     Cholecalciferol (VITAMIN D3) 50 MCG (2000 UT) CHEW Chew 2,000 Units by mouth daily.     ezetimibe (ZETIA) 10 MG tablet Take 10 mg by mouth daily.     fluticasone (FLONASE) 50 MCG/ACT nasal spray Place 2 sprays into both nostrils daily as needed for allergies or rhinitis.     levothyroxine (SYNTHROID) 125 MCG tablet Take 125 mcg by mouth daily before breakfast.     lisinopril (PRINIVIL,ZESTRIL) 5 MG tablet Take 5 mg by mouth daily.     metoprolol  tartrate (LOPRESSOR) 25 MG tablet Take 12.5 mg by mouth 2 (two) times daily.      Polyethylene Glycol 400 (BLINK TEARS) 0.25 % SOLN Place 1 drop into both eyes 2 (two) times daily as needed (dry eyes).     rosuvastatin (CRESTOR) 40 MG tablet Take 40 mg by mouth daily.     tamsulosin (FLOMAX) 0.4 MG CAPS capsule Take 1 capsule (0.4 mg total) by mouth 2 (two) times daily. (Patient taking differently: Take 0.4 mg by mouth daily.) 60 capsule 5   nitroGLYCERIN (NITROSTAT) 0.4 MG SL tablet Place 1 tablet (0.4 mg total) under the tongue every 5 (five) minutes as needed for chest pain. (Patient not taking: Reported on 07/10/2021) 25 tablet 3   No current facility-administered medications for this visit.    No Known Allergies    Review of Systems:   General:  normal appetite, + decreased energy, no weight gain, no weight loss, no fever  Cardiac:  no chest pain with exertion, no chest pain at rest, +SOB with mild exertion, no resting SOB, no PND, no orthopnea, no palpitations, no arrhythmia, no atrial fibrillation, no LE edema, no dizzy spells, no syncope  Respiratory:  + exertional shortness of breath, no home oxygen, no productive cough, no dry cough, no bronchitis, no wheezing, no hemoptysis, no asthma, no pain with inspiration or cough, no sleep apnea, no CPAP at night  GI:   No difficulty swallowing, no reflux, no frequent heartburn, no hiatal hernia, no abdominal pain, no constipation, no diarrhea, no hematochezia, no hematemesis, no melena  GU:   no dysuria,  no frequency, no urinary tract infection, no hematuria, no enlarged prostate, no kidney stones, no kidney disease  Vascular:  no pain suggestive of claudication, no pain in feet, no leg cramps, no varicose veins, no DVT, no non-healing foot ulcer  Neuro:   no stroke, no TIA's,  no seizures, no headaches, no temporary blindness one eye,  no slurred speech, no peripheral neuropathy, no chronic pain, no instability of gait, no memory/cognitive  dysfunction  Musculoskeletal: + arthritis, no joint swelling, no myalgias, no difficulty walking, normal mobility   Skin:   no rash, no itching, no skin infections, no pressure sores or ulcerations  Psych:   no anxiety, no depression, no nervousness, no unusual recent stress  Eyes:   no blurry vision, + floaters, no recent vision changes, + wears glasses   ENT:   + hearing loss, no loose or painful teeth, no dentures, last saw dentist recently  Hematologic:  no easy bruising, no abnormal bleeding, no clotting disorder, no frequent epistaxis  Endocrine:  no diabetes, does not check CBG's at home     Physical Exam:   BP 140/70   Pulse 70   Resp 20   Ht 5\' 7"  (1.702 m)   Wt 205 lb (93 kg)   SpO2 95% Comment: RA  BMI 32.11 kg/m   General:  well-appearing  HEENT:  Unremarkable, NCAT, PERLA, EOMI  Neck:   no JVD, no bruits, no adenopathy   Chest:   clear to auscultation, symmetrical breath sounds, no wheezes, no rhonchi   CV:   RRR, 3/6 systolic murmur RSB, no diastolic murmur  Abdomen:  soft, non-tender, no masses   Extremities:  warm, well-perfused, pulses palpable at ankle, no lower extremity edema  Rectal/GU  Deferred  Neuro:   Grossly non-focal and symmetrical throughout  Skin:   Clean and dry, no rashes, no breakdown  Diagnostic Tests:  ECHOCARDIOGRAM REPORT         Patient Name:   Doug Lynann Bologna Date of Exam: 02/28/2021  Medical Rec #:  [id number]      Height:       67.0 in  Accession #:    [id number]     Weight:       208.8 lb  Date of Birth:  02/19/1951      BSA:          2.060 m  Patient Age:    48 years       BP:           124/70 mmHg  Patient Gender: M              HR:           78 bpm.  Exam Location:  [address]   Procedure: 2D Echo, Cardiac Doppler and Color Doppler   Indications:    I35.0 Aortic Stenosis     History:        Patient has prior history of Echocardiogram examinations,  most                  recent 07/14/2017. Risk Factors:Hypertension and   Dyslipidemia.                  Murmur. Hypothyroidism. Myocardial infarction. Coronary  artery                  disease.     Sonographer:    Cresenciano Lick RDCS  Referring Phys: [id number] WEAVER   IMPRESSIONS     1. The aortic valve is calcified. There is moderate-to-severe  calcification of the aortic valve. There is moderate-to-severe thickening  of the aortic valve. Aortic valve regurgitation is trivial. There is  moderate-to-severe aortic valve stenosis with  AVA 0.91cm2 by continuity, mean gradient 67mmHg, peak gradient  24mmHg,  Vmax 3.46m/s, DI 0.25. If clinically indicated/concerned for symptomatic  aortic stenosis, would recommend evaluation by structural team.   2. Left ventricular ejection fraction, by estimation, is 65 to 70%. The  left ventricle has normal function. The left ventricle has no regional  wall motion abnormalities. Left ventricular diastolic parameters are  indeterminate.   3. Right ventricular systolic function is normal. The right ventricular  size is normal. Tricuspid regurgitation signal is inadequate for assessing  PA pressure.   4. The mitral valve is normal in structure. Trivial mitral valve  regurgitation.   5. Aortic dilatation noted. There is mild dilatation of the ascending  aorta, measuring 36 mm.   6. The inferior vena cava is normal in size with greater than 50%  respiratory variability, suggesting right atrial pressure of 3 mmHg.   FINDINGS   Left Ventricle: Left ventricular ejection fraction, by estimation, is 65  to 70%. The left ventricle has normal function. The left ventricle has no  regional wall motion abnormalities. The left ventricular internal cavity  size was normal in size. There is   borderline concentric left ventricular hypertrophy. Left ventricular  diastolic parameters are indeterminate.   Right Ventricle: The right ventricular size is normal. No increase in  right ventricular wall thickness. Right  ventricular systolic function is  normal. Tricuspid regurgitation signal is inadequate for assessing PA  pressure.   Left Atrium: Left atrial size was normal in size.   Right Atrium: Right atrial size was normal in size.   Pericardium: There is no evidence of pericardial effusion.   Mitral Valve: The mitral valve is normal in structure. Trivial mitral  valve regurgitation.   Tricuspid Valve: The tricuspid valve is normal in structure. Tricuspid  valve regurgitation is not demonstrated.   Aortic Valve: The aortic valve is calcified. There is moderate  calcification of the aortic valve. There is moderate thickening of the  aortic valve. Aortic valve regurgitation is trivial. Aortic regurgitation  PHT measures 440 msec. Moderate to severe  aortic stenosis is present.AVA 0.91cm2, mean gradient 39mmHg, peak  gradient 47mmHg, Vmax 3.102m/s, DI 0.25   Pulmonic Valve: The pulmonic valve was normal in structure. Pulmonic valve  regurgitation is trivial.   Aorta: Aortic dilatation noted. There is mild dilatation of the ascending  aorta, measuring 36 mm.   Venous: The inferior vena cava is normal in size with greater than 50%  respiratory variability, suggesting right atrial pressure of 3 mmHg.   IAS/Shunts: No atrial level shunt detected by color flow Doppler.      LEFT VENTRICLE  PLAX 2D  LVIDd:         4.40 cm  Diastology  LVIDs:         2.50 cm  LV e' medial:    8.08 cm/s  LV PW:         1.00 cm  LV E/e' medial:  11.8  LV IVS:        1.00 cm  LV e' lateral:   10.40 cm/s  LVOT diam:     2.10 cm  LV E/e' lateral: 9.1  LV SV:         67  LV SV Index:   33  LVOT Area:     3.46 cm      RIGHT VENTRICLE             IVC  RV Basal diam:  4.20 cm     IVC diam: 1.60 cm  RV S prime:  19.50 cm/s  TAPSE (M-mode): 2.2 cm   LEFT ATRIUM             Index       RIGHT ATRIUM           Index  LA diam:        3.90 cm 1.89 cm/m  RA Area:     14.70 cm  LA Vol (A2C):   55.5 ml 26.95 ml/m  RA Volume:   37.60 ml  18.26 ml/m  LA Vol (A4C):   37.6 ml 18.26 ml/m  LA Biplane Vol: 46.7 ml 22.67 ml/m   AORTIC VALVE  AV Area (Vmax):    0.93 cm  AV Area (Vmean):   0.87 cm  AV Area (VTI):     0.94 cm  AV Vmax:           372.60 cm/s  AV Vmean:          263.800 cm/s  AV VTI:            0.718 m  AV Peak Grad:      55.5 mmHg  AV Mean Grad:      31.8 mmHg  LVOT Vmax:         100.25 cm/s  LVOT Vmean:        66.200 cm/s  LVOT VTI:          0.194 m  LVOT/AV VTI ratio: 0.27  AI PHT:            440 msec     AORTA  Ao Root diam: 3.40 cm  Ao Asc diam:  3.60 cm   MITRAL VALVE  MV Area (PHT): 3.17 cm    SHUNTS  MV Decel Time: 239 msec    Systemic VTI:  0.19 m  MV E velocity: 95.10 cm/s  Systemic Diam: 2.10 cm  MV A velocity: 79.50 cm/s  MV E/A ratio:  1.20   Gwyndolyn Kaufman MD  Electronically signed by Gwyndolyn Kaufman MD  Signature Date/Time: 02/28/2021/3:23:21 PM   Physicians  Panel Physicians Referring Physician Case Authorizing Physician  Burnell Blanks, MD (Primary)     Procedures  RIGHT/LEFT HEART CATH AND CORONARY ANGIOGRAPHY   Conclusion    Prox RCA lesion is 20% stenosed. Prox LAD to Mid LAD lesion is 20% stenosed. Dist LAD lesion is 30% stenosed. 1st Diag lesion is 10% stenosed.   1. Patent Diagonal stent 2. Mild non-obstructive plaque in the LAD and RCA 3. Likely severe aortic stenosis by echo (cath data with mean gradient 23 mmHg, peak gradient 25 mm Hg)   Recommendations: Will continue workup for potential TAVR. His aortic valve appears to be severely stenotic by echo imaging. Echo data supports moderate to severe AS. Cath data supports moderate AS. Given his symptoms, will plan cardiac CT to better assess his valve.    Indications  Nonrheumatic aortic valve stenosis [I35.0 (ICD-10-CM)]   Procedural Details  Technical Details Indication: Severe aortic stenosis, TAVR workup.   Procedure: The risks, benefits, complications, treatment  options, and expected outcomes were discussed with the patient. The patient and/or family concurred with the proposed plan, giving informed consent. The patient was brought to the cath lab after IV hydration was given. The patient was sedated with Versed and Fentanyl. The IV catheter present in the right antecubital vein was changed for a 5 [place] sheath. Right heart catheterization performed with a balloon tipped catheter. The right wrist was prepped and draped in a sterile fashion. 1%  lidocaine was used for local anesthesia. Using the modified Seldinger access technique, a 5 French sheath was placed in the right radial artery. 3 mg Verapamil was given through the sheath. 5000 units IV heparin was given. Standard diagnostic catheters were used to perform selective coronary angiography. The aortic valve was crossed with a JR4 catheter and the J wire. LV pressures measured. No LV gram. The sheath was removed from the right radial artery and a Terumo hemostasis band was applied at the arteriotomy site on the right wrist.    Estimated blood loss <50 mL.   During this procedure medications were administered to achieve and maintain moderate conscious sedation while the patient's heart rate, blood pressure, and oxygen saturation were continuously monitored and I was present face-to-face 100% of this time.   Medications (Filter: Administrations occurring from [phone] to 0950 on 04/01/21)  important  Continuous medications are totaled by the amount administered until 04/01/21 0950.   midazolam (VERSED) injection (mg) Total dose:  2 mg Date/Time Rate/Dose/Volume Action   04/01/21 0909 2 mg Given    fentaNYL (SUBLIMAZE) injection (mcg) Total dose:  50 mcg Date/Time Rate/Dose/Volume Action   04/01/21 0909 50 mcg Given    Heparin (Porcine) in NaCl 1000-0.9 UT/500ML-% SOLN (mL) Total volume:  1,000 mL Date/Time Rate/Dose/Volume Action   04/01/21 0909 500 mL Given   0909 500 mL Given    lidocaine (PF)  (XYLOCAINE) 1 % injection (mL) Total volume:  2 mL Date/Time Rate/Dose/Volume Action   04/01/21 Canceled Entry   0915 2 mL Given    Radial Cocktail/Verapamil only (mL) Total volume:  10 mL Date/Time Rate/Dose/Volume Action   04/01/21 0921 10 mL Given    heparin sodium (porcine) injection (Units) Total dose:  5,000 Units Date/Time Rate/Dose/Volume Action   04/01/21 0927 5,000 Units Given    iohexol (OMNIPAQUE) 350 MG/ML injection (mL) Total volume:  50 mL Date/Time Rate/Dose/Volume Action   04/01/21 0941 50 mL Given    Sedation Time  Sedation Time Physician-1: 26 minutes 42 seconds Contrast  Medication Name Total Dose  iohexol (OMNIPAQUE) 350 MG/ML injection 50 mL   Radiation/Fluoro  Fluoro time: 4.3 (min) DAP: 13.9 (Gycm2) Cumulative Air Kerma: 416.6 (mGy) Complications  Complications documented before study signed (04/01/2021  9:50 AM)   RIGHT/LEFT HEART CATH AND CORONARY ANGIOGRAPHY  None Documented by Burnell Blanks, MD 04/01/2021  9:43 AM  Date Found: 04/01/2021  Time Range: Intraprocedure       Coronary Findings  Diagnostic Dominance: Right Left Anterior Descending  Prox LAD to Mid LAD lesion is 20% stenosed.  Dist LAD lesion is 30% stenosed.  First Diagonal Branch  Vessel is moderate in size.  1st Diag lesion is 10% stenosed. The lesion was previously treated using a stent (unknown type) over 2 years ago.  Left Circumflex  Vessel is large.  Right Coronary Artery  Vessel is large.  Prox RCA lesion is 20% stenosed.  Intervention  No interventions have been documented. Coronary Diagrams  Diagnostic Dominance: Right Intervention  Implants     No implant documentation for this case.   Syngo Images   Show images for CARDIAC CATHETERIZATION Images on Long Term Storage   Show images for [place], [place] to Procedure Log  Procedure Log    Hemo Data  Flowsheet Row Most Recent Value  Fick Cardiac Output 4.97 L/min  Fick  Cardiac Output Index 2.43 (L/min)/BSA  Aortic Mean Gradient 23 mmHg  Aortic Peak Gradient 25 mmHg  Aortic Valve  Area 1.35  Aortic Value Area Index 0.66 cm2/BSA  RA A Wave 11 mmHg  RA V Wave 6 mmHg  RA Mean 7 mmHg  RV Systolic Pressure 33 mmHg  RV Diastolic Pressure 6 mmHg  RV EDP 10 mmHg  PA Systolic Pressure 31 mmHg  PA Diastolic Pressure 8 mmHg  PA Mean 20 mmHg  PW A Wave 14 mmHg  PW V Wave 16 mmHg  PW Mean 12 mmHg  AO Systolic Pressure 570 mmHg  AO Diastolic Pressure 59 mmHg  AO Mean 77 mmHg  LV Systolic Pressure 177 mmHg  LV Diastolic Pressure 3 mmHg  LV EDP 16 mmHg  AOp Systolic Pressure 939 mmHg  AOp Diastolic Pressure 58 mmHg  AOp Mean Pressure 79 mmHg  LVp Systolic Pressure 030 mmHg  LVp Diastolic Pressure 7 mmHg  LVp EDP Pressure 17 mmHg  QP/QS 1  TPVR Index 8.22 HRUI  TSVR Index 31.66 HRUI  PVR SVR Ratio 0.11  TPVR/TSVR Ratio 0.26    ADDENDUM REPORT: 04/16/2021 20:23   CLINICAL DATA:  Severe Aortic Stenosis.   EXAM: Cardiac TAVR CT   TECHNIQUE: A non-contrast, gated CT scan was obtained with axial slices of 3 mm through the heart for aortic valve calcium scoring. A 110 kV retrospective, gated, contrast cardiac scan was obtained. Gantry rotation speed was 250 msecs and collimation was 0.6 mm. Nitroglycerin was not given. The 3D data set was reconstructed in 5% intervals of the 0-95% of the R-R cycle. Systolic and diastolic phases were analyzed on a dedicated workstation using MPR, MIP, and VRT modes. The patient received 100 cc of contrast.   FINDINGS: Image quality: Excellent.   Noise artifact is: Limited.   Valve Morphology: The aortic valve is severely calcified with restricted leaflet motion in systole. There is severe bulky calcification on the RCC. There in incomplete leaflet coaptation noted.   Aortic Valve Calcium score: 1957   Aortic annular dimension:   Phase assessed: 20%   Annular area: 518 mm2   Annular perimeter: 82.1 mm    Max diameter: 29.6 mm   Min diameter: 23.6 mm   Annular and subannular calcification: None.   Optimal coplanar projection: LAO 23 CAU 6   Coronary Artery Height above Annulus:   Left Main: 15.9 mm   Right Coronary: 20.1 mm   Sinus of Valsalva Measurements:   Non-coronary: 33 mm   Right-coronary: 32 mm   Left-coronary: 35 mm   Sinus of Valsalva Height:   Non-coronary: 24.0 mm   Right-coronary: 20.1 mm   Left-coronary: 21.6 mm   Sinotubular Junction: 28 mm   Ascending Thoracic Aorta: 33 mm   Coronary Arteries: Normal coronary origin. Right dominance. The study was performed without use of NTG and is insufficient for plaque evaluation. Please refer to recent cardiac catheterization for coronary assessment. 3-vessel calcifications noted.   Cardiac Morphology:   Right Atrium: Right atrial size is within normal limits.   Right Ventricle: The right ventricular cavity is within normal limits.   Left Atrium: Left atrial size is normal in size with no left atrial appendage filling defect.   Left Ventricle: The ventricular cavity size is within normal limits. There are no stigmata of prior infarction. There is no abnormal filling defect. Normal left ventricular function, EF=66%. No regional wall motion abnormalities.   Pulmonary arteries: Normal in size without proximal filling defect.   Pulmonary veins: Normal pulmonary venous drainage.   Pericardium: Normal thickness with no significant effusion or calcium present.  Mitral Valve: The mitral valve is normal structure without significant calcification.   Extra-cardiac findings: See attached radiology report for non-cardiac structures.   IMPRESSION: 1. Severely calcified, tricuspid aortic valve with restricted leaflet motion. Aortic valve calcium score 1957.   2. Annular measurements appropriate for 26 mm S3 (518 mm2).   3. Sufficient coronary to annulus distance.   4. Optimal Fluoroscopic Angle for  Delivery: LAO 23 CAU 6   Cressey T. Audie Box, MD     Electronically Signed   By: Eleonore Chiquito   On: 04/16/2021 20:23    Addended by Geralynn Rile, MD on 04/16/2021  8:25 PM   Study Result  Narrative & Impression  EXAM: OVER-READ INTERPRETATION  CT CHEST   The following report is an over-read performed by radiologist Dr. Vinnie Langton of [facility], [place] on 04/16/2021. This over-read does not include interpretation of cardiac or coronary anatomy or pathology. The coronary calcium score/coronary CTA interpretation by the cardiologist is attached.   COMPARISON:  None.   FINDINGS: Extracardiac findings will be described separately under dictation for contemporaneously obtained CTA chest, abdomen and pelvis dated 04/16/2021.   IMPRESSION: Please see separate dictation for contemporaneously obtained CTA chest, abdomen and pelvis 04/16/2021 for full description of relevant extracardiac findings.   Electronically Signed: By: Vinnie Langton M.D. On: 04/16/2021 11:18      Narrative & Impression  CLINICAL DATA:  70 year old male with history of severe aortic stenosis. Preprocedural study prior to potential transcatheter aortic valve replacement (TAVR) procedure.   EXAM: CT ANGIOGRAPHY CHEST, ABDOMEN AND PELVIS   TECHNIQUE: Multidetector CT imaging through the chest, abdomen and pelvis was performed using the standard protocol during bolus administration of intravenous contrast. Multiplanar reconstructed images and MIPs were obtained and reviewed to evaluate the vascular anatomy.   CONTRAST:  171mL OMNIPAQUE IOHEXOL 350 MG/ML SOLN   COMPARISON:  No priors.   FINDINGS: CTA CHEST FINDINGS   Cardiovascular: Heart size is normal. There is no significant pericardial fluid, thickening or pericardial calcification. There is aortic atherosclerosis, as well as atherosclerosis of the great vessels of the mediastinum and the coronary arteries,  including calcified atherosclerotic plaque in the left main, left anterior descending, left circumflex and right coronary arteries. Severe thickening and calcification of the aortic valve.   Mediastinum/Lymph Nodes: No pathologically enlarged mediastinal or hilar lymph nodes. Please note that accurate exclusion of hilar adenopathy is limited on noncontrast CT scans. Esophagus is unremarkable in appearance. No axillary lymphadenopathy.   Lungs/Pleura: No acute consolidative airspace disease. No pleural effusions. No suspicious appearing pulmonary nodules or masses are noted.   Musculoskeletal/Soft Tissues: There are no aggressive appearing lytic or blastic lesions noted in the visualized portions of the skeleton.   CTA ABDOMEN AND PELVIS FINDINGS   Hepatobiliary: No suspicious cystic or solid hepatic lesions. No intra or extrahepatic biliary ductal dilatation. Gallbladder is normal in appearance.   Pancreas: No pancreatic mass. No pancreatic ductal dilatation. No pancreatic or peripancreatic fluid collections or inflammatory changes.   Spleen: Unremarkable.   Adrenals/Urinary Tract: Exophytic 1.8 cm low-attenuation lesion in the upper pole of the left kidney, compatible with a simple cyst. Right kidney and bilateral adrenal glands are normal in appearance. No hydroureteronephrosis. Urinary bladder is normal in appearance.   Stomach/Bowel: Normal appearance of the stomach. No pathologic dilatation of small bowel or colon. Numerous colonic diverticulae are noted, particularly in the descending colon and sigmoid colon, without surrounding inflammatory changes to suggest an acute diverticulitis at this time.  Normal appendix.   Vascular/Lymphatic: Vascular findings and measurements pertinent to potential TAVR procedure, as detailed below. No aneurysm or dissection noted in the abdominal or pelvic vasculature. No lymphadenopathy noted in the abdomen or pelvis.   Reproductive:  Brachytherapy implants within the prostate gland. Seminal vesicles are unremarkable in appearance.   Other: No significant volume of ascites.  No pneumoperitoneum.   Musculoskeletal: Status post right hip arthroplasty. Status post laminectomy at L4-L5. There are no aggressive appearing lytic or blastic lesions noted in the visualized portions of the skeleton.   VASCULAR MEASUREMENTS PERTINENT TO TAVR:   AORTA:   Minimal Aortic Diameter-11 x 11 mm   Severity of Aortic Calcification-severe   RIGHT PELVIS:   Right Common Iliac Artery -   Minimal Diameter-6.3 x 7.2 mm   Tortuosity-mild   Calcification-moderate   Right External Iliac Artery -   Minimal Diameter-6.5 x 6.1 mm   Tortuosity-moderate   Calcification-mild   Right Common Femoral Artery -   Minimal Diameter-7.3 x 7.7 mm   Tortuosity-mild   Calcification-mild-to-moderate   LEFT PELVIS:   Left Common Iliac Artery -   Minimal Diameter-6.5 x 5.1 mm   Tortuosity-mild   Calcification-moderate   Left External Iliac Artery -   Minimal Diameter-7.5 x 5.2 mm   Tortuosity-moderate   Calcification-mild   Left Common Femoral Artery -   Minimal Diameter-7.2 x 6.8 mm   Tortuosity-mild   Calcification-mild   Review of the MIP images confirms the above findings.   IMPRESSION: 1. Vascular findings and measurements pertinent to potential TAVR procedure, as detailed above. 2. Severe thickening and calcification of the aortic valve, compatible with reported clinical history of severe aortic stenosis. 3. Aortic atherosclerosis, in addition to left main and 3 vessel coronary artery disease. 4. Severe colonic diverticulosis without evidence of acute diverticulitis at this time 5. Additional incidental findings, as above.     Electronically Signed   By: Vinnie Langton M.D.   On: 04/16/2021 12:07    STS Risk Score: AVR + CABG Risk of Mortality: 0.931% Renal Failure: 1.293% Permanent  Stroke: 1.056% Prolonged Ventilation: 5.483% DSW Infection: 0.237% Reoperation: 2.899% Morbidity or Mortality: 9.079% Short Length of Stay: 57.568% Long Length of Stay: 3.121%  Impression:  This 70 year old gentleman has stage D3, severe, symptomatic aortic stenosis with New York Heart Association class II symptoms of exertional fatigue and shortness of breath consistent with chronic diastolic congestive heart failure.  I have personally reviewed his 2D echocardiogram, cardiac catheterization, and CTA studies.  His echocardiogram showed a heavily calcified aortic valve with leaflet thickening and restricted mobility with a mean gradient of 35 mmHg and a peak gradient of 63 mmHg.  Peak velocity was 3.96 m/s.  Dimensionless index was 0.25 with a valve area of 0.91 cm consistent with severe aortic stenosis.  Left ventricular function was preserved.  Cardiac catheterization showed a patent diagonal stent and otherwise mild nonobstructive disease in the LAD and RCA.  The mean gradient was measured at 23 mmHg.  I agree that aortic valve replacement is indicated in this patient for relief of his symptoms and to prevent progressive left ventricular deterioration.  He is only 70 years old but he is a ongoing heavy smoker with significant arthritis and I think transcatheter aortic valve replacement is a reasonable alternative for him.  His gated cardiac CTA shows a heavily calcified aortic valve with restricted opening.  Anatomy appears suitable for a 26 mm SAPIEN 3 valve.  His abdominal and pelvic CTA  shows adequate pelvic vascular anatomy to allow transfemoral insertion.  The patient was counseled at length regarding treatment alternatives for management of severe symptomatic aortic stenosis. The risks and benefits of surgical intervention has been discussed in detail. Long-term prognosis with medical therapy was discussed. Alternative approaches such as conventional surgical aortic valve replacement,  transcatheter aortic valve replacement, and palliative medical therapy were compared and contrasted at length. This discussion was placed in the context of the patient's own specific clinical presentation and past medical history. All of his questions have been addressed.   Following the decision to proceed with transcatheter aortic valve replacement, a discussion was held regarding what types of management strategies would be attempted intraoperatively in the event of life-threatening complications, including whether or not the patient would be considered a candidate for the use of cardiopulmonary bypass and/or conversion to open sternotomy for attempted surgical intervention.  I think he would be a candidate for emergent sternotomy to manage any intraoperative complications the patient is aware of the fact that transient use of cardiopulmonary bypass may be necessary. The patient has been advised of a variety of complications that might develop including but not limited to risks of death, stroke, paravalvular leak, aortic dissection or other major vascular complications, aortic annulus rupture, device embolization, cardiac rupture or perforation, mitral regurgitation, acute myocardial infarction, arrhythmia, heart block or bradycardia requiring permanent pacemaker placement, congestive heart failure, respiratory failure, renal failure, pneumonia, infection, other late complications related to structural valve deterioration or migration, or other complications that might ultimately cause a temporary or permanent loss of functional independence or other long term morbidity. The patient provides full informed consent for the procedure as described and all questions were answered.      Plan:  He will be scheduled for transfemoral transcatheter aortic valve replacement using a SAPIEN 3 valve on 07/16/2021.  I spent 60 minutes performing this consultation and > 50% of this time was spent face to face counseling  and coordinating the care of this patient's severe symptomatic aortic stenosis.   Gaye Pollack, MD 07/10/2021 1:59 PM

## 2021-07-11 NOTE — Progress Notes (Signed)
Surgical Instructions    Your procedure is scheduled on Tueday October 4th.  Report to [facility] Main Entrance "A" at 8:45 A.M., then check in with the Admitting office.  Call this number if you have problems the morning of surgery:  [phone]   If you have any questions prior to your surgery date call [phone]: Open Monday-Friday 8am-4pm    Remember:  Do not eat or drink anything after midnight the night before your surgery     Take these medicines the morning of surgery with A SIP OF WATER  NONE  Continue taking all other medications without change through the day before surgery. On the morning of surgery, take no medications  After your COVID test   You are not required to quarantine however you are required to wear a well-fitting mask when you are out and around people not in your household.  If your mask becomes wet or soiled, replace with a new one.  Wash your hands often with soap and water for 20 seconds or clean your hands with an alcohol-based hand sanitizer that contains at least 60% alcohol.  Do not share personal items.  Notify your provider: if you are in close contact with someone who has COVID  or if you develop a fever of 100.4 or greater, sneezing, cough, sore throat, shortness of breath or body aches.    As of today, STOP taking any Aspirin (unless otherwise instructed by your surgeon) Aleve, Naproxen, Ibuprofen, Motrin, Advil, Goody's, BC's, all herbal medications, fish oil, and all vitamins.          Do not wear jewelry  Do not wear lotions, powders, colognes, or deodorant. Do not shave 48 hours prior to surgery.  Men may shave face and neck. Do not bring valuables to the hospital. DO Not wear nail polish, gel polish, artificial nails, or any other type of covering on natural nails including finger and toenails. If patients have artificial nails, gel coating, etc. that need to be removed by a nail salon please have this removed prior to surgery or  surgery may need to be canceled/delayed if the surgeon/ anesthesia feels like the patient is unable to be adequately monitored.             [facility] is not responsible for any belongings or valuables.  Do NOT Smoke (Tobacco/Vaping)  24 hours prior to your procedure If you use a CPAP at night, you may bring your mask for your overnight stay.   Contacts, glasses, dentures or bridgework may not be worn into surgery, please bring cases for these belongings   For patients admitted to the hospital, discharge time will be determined by your treatment team.   Patients discharged the day of surgery will not be allowed to drive home, and someone needs to stay with them for 24 hours.  NO VISITORS WILL BE ALLOWED IN PRE-OP WHERE PATIENTS ARE PREPPED FOR SURGERY.  ONLY 1 SUPPORT PERSON MAY BE PRESENT IN THE WAITING ROOM WHILE YOU ARE IN SURGERY.  IF YOU ARE TO BE ADMITTED, ONCE YOU ARE IN YOUR ROOM YOU WILL BE ALLOWED TWO (2) VISITORS. 1 (ONE) VISITOR MAY STAY OVERNIGHT BUT MUST ARRIVE TO THE ROOM BY 8pm.  Minor children may have two parents present. Special consideration for safety and communication needs will be reviewed on a case by case basis.  Special instructions:    Oral Hygiene is also important to reduce your risk of infection.  Remember - BRUSH YOUR TEETH  THE MORNING OF SURGERY WITH YOUR REGULAR TOOTHPASTE   [facility]- Preparing For Surgery  Before surgery, you can play an important role. Because skin is not sterile, your skin needs to be as free of germs as possible. You can reduce the number of germs on your skin by washing with CHG (chlorahexidine gluconate) Soap before surgery.  CHG is an antiseptic cleaner which kills germs and bonds with the skin to continue killing germs even after washing.     Please do not use if you have an allergy to CHG or antibacterial soaps. If your skin becomes reddened/irritated stop using the CHG.  Do not shave (including legs and underarms) for at least  48 hours prior to first CHG shower. It is OK to shave your face.  Please follow these instructions carefully.     Shower the NIGHT BEFORE SURGERY and the MORNING OF SURGERY with CHG Soap.   If you chose to wash your hair, wash your hair first as usual with your normal shampoo. After you shampoo, rinse your hair and body thoroughly to remove the shampoo.  Then [employer] and genitals (private parts) with your normal soap and rinse thoroughly to remove soap.  After that Use CHG Soap as you would any other liquid soap. You can apply CHG directly to the skin and wash gently with a scrungie or a clean washcloth.   Apply the CHG Soap to your body ONLY FROM THE NECK DOWN.  Do not use on open wounds or open sores. Avoid contact with your eyes, ears, mouth and genitals (private parts). Wash Face and genitals (private parts)  with your normal soap.   Wash thoroughly, paying special attention to the area where your surgery will be performed.  Thoroughly rinse your body with warm water from the neck down.  DO NOT shower/wash with your normal soap after using and rinsing off the CHG Soap.  Pat yourself dry with a CLEAN TOWEL.  Wear CLEAN PAJAMAS to bed the night before surgery  Place CLEAN SHEETS on your bed the night before your surgery  DO NOT SLEEP WITH PETS.   Day of Surgery:  Take a shower with CHG soap. Wear Clean/Comfortable clothing the morning of surgery Do not apply any deodorants/lotions.   Remember to brush your teeth WITH YOUR REGULAR TOOTHPASTE.   Please read over the following fact sheets that you were given.

## 2021-07-12 ENCOUNTER — Encounter (HOSPITAL_COMMUNITY): Payer: Self-pay

## 2021-07-12 ENCOUNTER — Ambulatory Visit (HOSPITAL_COMMUNITY)
Admission: RE | Admit: 2021-07-12 | Discharge: 2021-07-12 | Disposition: A | Discharge status: Home / Self Care | Payer: Medicare Other | Source: Ambulatory Visit | Attending: Physician Assistant | Admitting: Physician Assistant

## 2021-07-12 ENCOUNTER — Other Ambulatory Visit: Payer: Self-pay

## 2021-07-12 ENCOUNTER — Encounter (HOSPITAL_COMMUNITY)
Admission: RE | Admit: 2021-07-12 | Discharge: 2021-07-12 | Disposition: A | Discharge status: Home / Self Care | Payer: Medicare Other | Source: Ambulatory Visit | Attending: Cardiovascular Disease | Admitting: Cardiovascular Disease

## 2021-07-12 ENCOUNTER — Telehealth: Payer: Self-pay | Admitting: Cardiovascular Disease

## 2021-07-12 DIAGNOSIS — E039 Hypothyroidism, unspecified: Secondary | ICD-10-CM | POA: Diagnosis not present

## 2021-07-12 DIAGNOSIS — Z20822 Contact with and (suspected) exposure to covid-19: Secondary | ICD-10-CM | POA: Insufficient documentation

## 2021-07-12 DIAGNOSIS — I350 Nonrheumatic aortic (valve) stenosis: Secondary | ICD-10-CM

## 2021-07-12 DIAGNOSIS — I2510 Atherosclerotic heart disease of native coronary artery without angina pectoris: Secondary | ICD-10-CM | POA: Diagnosis not present

## 2021-07-12 DIAGNOSIS — Z01818 Encounter for other preprocedural examination: Secondary | ICD-10-CM | POA: Insufficient documentation

## 2021-07-12 DIAGNOSIS — I10 Essential (primary) hypertension: Secondary | ICD-10-CM | POA: Insufficient documentation

## 2021-07-12 DIAGNOSIS — Z01811 Encounter for preprocedural respiratory examination: Secondary | ICD-10-CM

## 2021-07-12 DIAGNOSIS — Z955 Presence of coronary angioplasty implant and graft: Secondary | ICD-10-CM | POA: Insufficient documentation

## 2021-07-12 LAB — COMPREHENSIVE METABOLIC PANEL
ALT: 34 U/L (ref 0–44)
AST: 36 U/L (ref 15–41)
Albumin: 4 g/dL (ref 3.5–5.0)
Alkaline Phosphatase: 59 U/L (ref 38–126)
Anion gap: 9 (ref 5–15)
BUN: 12 mg/dL (ref 8–23)
CO2: 21 mmol/L — ABNORMAL LOW (ref 22–32)
Calcium: 10.4 mg/dL — ABNORMAL HIGH (ref 8.9–10.3)
Chloride: 107 mmol/L (ref 98–111)
Creatinine, Ser: 1.01 mg/dL (ref 0.61–1.24)
GFR, Estimated: >60 mL/min (ref >60)
Glucose, Bld: 101 mg/dL — ABNORMAL HIGH (ref 70–99)
Potassium: 4.2 mmol/L (ref 3.5–5.1)
Sodium: 137 mmol/L (ref 135–145)
Total Bilirubin: 0.7 mg/dL (ref 0.3–1.2)
Total Protein: 6.7 g/dL (ref 6.5–8.1)

## 2021-07-12 LAB — PROTIME-INR
INR: 1 (ref 0.8–1.2)
Prothrombin Time: 12.7 seconds (ref 11.4–15.2)

## 2021-07-12 LAB — CBC
HCT: 45.2 % (ref 39.0–52.0)
Hemoglobin: 15 g/dL (ref 13.0–17.0)
MCH: 31.7 pg (ref 26.0–34.0)
MCHC: 33.2 g/dL (ref 30.0–36.0)
MCV: 95.6 fL (ref 80.0–100.0)
Platelets: 209 10*3/uL (ref 150–400)
RBC: 4.73 MIL/uL (ref 4.22–5.81)
RDW: 12.8 % (ref 11.5–15.5)
WBC: 7 10*3/uL (ref 4.0–10.5)
nRBC: 0 % (ref 0.0–0.2)

## 2021-07-12 LAB — BLOOD GAS, ARTERIAL
Acid-base deficit: 0.9 mmol/L (ref 0.0–2.0)
Bicarbonate: 23.1 mmol/L (ref 20.0–28.0)
FIO2: 21
O2 Saturation: 97.2 %
Patient temperature: 37
pCO2 arterial: 37.4 mmHg (ref 32.0–48.0)
pH, Arterial: 7.407 (ref 7.350–7.450)
pO2, Arterial: 90.9 mmHg (ref 83.0–108.0)

## 2021-07-12 LAB — URINALYSIS, ROUTINE W REFLEX MICROSCOPIC
Bacteria, UA: NONE SEEN
Bilirubin Urine: NEGATIVE
Glucose, UA: NEGATIVE mg/dL
Ketones, ur: NEGATIVE mg/dL
Leukocytes,Ua: NEGATIVE
Nitrite: NEGATIVE
Protein, ur: NEGATIVE mg/dL
Specific Gravity, Urine: 1.003 — ABNORMAL LOW (ref 1.005–1.030)
pH: 7 (ref 5.0–8.0)

## 2021-07-12 LAB — TYPE AND SCREEN
ABO/RH(D): A POS
Antibody Screen: NEGATIVE

## 2021-07-12 LAB — SARS CORONAVIRUS 2 (TAT 6-24 HRS): SARS Coronavirus 2: NEGATIVE

## 2021-07-12 LAB — SURGICAL PCR SCREEN
MRSA, PCR: POSITIVE — AB
Staphylococcus aureus: POSITIVE — AB

## 2021-07-12 NOTE — Progress Notes (Addendum)
Surgical PCR resulted positive for staph and MRSA.  Called Dr. Camillia Herter office to make them aware. Spoke to [employer].

## 2021-07-12 NOTE — Progress Notes (Signed)
   07/12/21 0814  OBSTRUCTIVE SLEEP APNEA  Have you ever been diagnosed with sleep apnea through a sleep study? No  Do you snore loudly (loud enough to be heard through closed doors)?  1  Do you often feel tired, fatigued, or sleepy during the daytime (such as falling asleep during driving or talking to someone)? 0  Has anyone observed you stop breathing during your sleep? 0  Do you have, or are you being treated for high blood pressure? 1  BMI more than 35 kg/m2? 0  Age > 50 (1-yes) 1  Neck circumference greater than:Male 16 inches or larger, Male 17inches or larger? 1  Male Gender (Yes=1) 1  Obstructive Sleep Apnea Score 5

## 2021-07-12 NOTE — Progress Notes (Signed)
PCP - Marilynne Drivers PA Cardiologist - Dr. Celso Amy  Chest x-ray - 07/12/21 EKG - 07/12/21 Stress Test - Yes 2017 in [place], [place] was normal ECHO - 07/14/17 Cardiac Cath - 02/18/10 and 04/01/21  Sleep Study - Yes no OSA  DM - Denies  Aspirin Instructions: Instructed to continue to day before surgery  COVID TEST- 07/12/32   Anesthesia review: Yes Cardiac history  Patient denies shortness of breath, fever, cough and chest pain at PAT appointment   All instructions explained to the patient, with a verbal understanding of the material. Patient agrees to go over the instructions while at home for a better understanding. Patient also instructed to wear a mask while in public after being tested for COVID-19. The opportunity to ask questions was provided.

## 2021-07-12 NOTE — Telephone Encounter (Signed)
This has been addressed by K. Grandville Silos, PA-C.

## 2021-07-12 NOTE — Telephone Encounter (Signed)
   nurse states  pts surgical pcr came back positive for  mrsa and staph

## 2021-07-14 ENCOUNTER — Encounter: Payer: Self-pay | Admitting: Surgery

## 2021-07-15 MED ORDER — CEFAZOLIN SODIUM-DEXTROSE 2-4 GM/100ML-% IV SOLN
2.0000 g | INTRAVENOUS | Status: AC
  Administered 2021-07-16: 2 g via INTRAVENOUS
  Filled 2021-07-15: qty 100

## 2021-07-15 MED ORDER — VANCOMYCIN HCL 1500 MG/300ML IV SOLN
1500.0000 mg | INTRAVENOUS | Status: AC
  Administered 2021-07-16: 1500 mg via INTRAVENOUS
  Filled 2021-07-15: qty 300

## 2021-07-15 MED ORDER — NOREPINEPHRINE 4 MG/250ML-% IV SOLN
0.0000 ug/min | INTRAVENOUS | Status: AC
  Administered 2021-07-16: 1 ug/min via INTRAVENOUS
  Filled 2021-07-15: qty 250

## 2021-07-15 MED ORDER — HEPARIN 30,000 UNITS/1000 ML (OHS) CELLSAVER SOLUTION
Status: DC
  Filled 2021-07-15: qty 1000

## 2021-07-15 MED ORDER — MAGNESIUM SULFATE 50 % IJ SOLN
40.0000 meq | INTRAMUSCULAR | Status: DC
  Filled 2021-07-15: qty 9.85

## 2021-07-15 MED ORDER — DEXMEDETOMIDINE HCL IN NACL 400 MCG/100ML IV SOLN
0.1000 ug/kg/h | INTRAVENOUS | Status: AC
  Administered 2021-07-16: 1 ug/kg/h via INTRAVENOUS
  Filled 2021-07-15: qty 100

## 2021-07-15 MED ORDER — POTASSIUM CHLORIDE 2 MEQ/ML IV SOLN
80.0000 meq | INTRAVENOUS | Status: DC
  Filled 2021-07-15: qty 40

## 2021-07-15 NOTE — Progress Notes (Signed)
Anesthesia Chart Review:  Oscar Bailey is a 70 yo male with a PMHx of HTN, CAD s/p PCI and stenting of diagonal branch in 2011, prostate CA treated with radiation seeds, hypothyroidism and moderate AS.   Patient seen by Dr. Cyndia Bent on 07/10/21 for TAVR workup, please refer to full note.   Scheduled for transfemoral transcatheter aortic valve replacement using a SAPIEN 3 valve on 07/16/21.   Of note during workup pts. Surgical PCR came back + for MRSA and Staph.   Vitals with BMI 07/12/2021 07/10/2021 04/16/2021  Height 5\' 7"  5\' 7"  -  Weight 214 lbs 205 lbs -  BMI 11.57 26.2 -  Systolic 035 597 416  Diastolic 75 70 78  Pulse 70 70 -    Junie Bame, DNP, Immunologist, NP-C Short Stay/Anesthesia

## 2021-07-15 NOTE — H&P (Signed)
[address]       [place] [address]             [phone]      Cardiothoracic Surgery Admission History and Physical   PCP is Lois Huxley, PA Referring Provider is Lauree Chandler, MD Primary Cardiologist is Mertie Moores, MD   Reason for admission: Severe aortic stenosis   HPI:   The patient is a 70 year old gentleman with a history of hypertension, hyperlipidemia, hypothyroidism, coronary artery disease status post PCI and stenting of a diagonal branch in 2011, prostate cancer treated with radiation seeds, and moderate aortic stenosis followed by echocardiogram.  2D echo in October 2018 showed a trileaflet moderately calcified aortic valve with a mean gradient of 20 mmHg and a dimensionless index of 0.28.  Left ventricular ejection fraction was 55 to 60%.  He was seen back for cardiology follow-up in May 2022 and was reporting exertional fatigue and chest discomfort.  A follow-up echo on 02/28/2021 showed an ejection fraction of 65 to 70%.  The mean gradient across aortic valve had increased to 35 mmHg with a peak gradient of 63 mmHg.  Aortic valve area was 0.87 cm with a dimensionless index of 0.27.  Stroke-volume index was 33.  He underwent cardiac catheterization on 04/01/2021 showing a patent diagonal stent and otherwise mild nonobstructive coronary disease.  The mean gradient across aortic valve was measured at 23 mmHg with a peak gradient of 25 mmHg.  A gated cardiac CTA was performed showing a severely calcified aortic valve with restricted leaflet mobility.  There was severe bulky calcification in the right coronary cusp.   He reports having exertional fatigue and shortness of breath over the past several months.  He denies any chest discomfort.  He has had no dizziness or syncope.  Denies orthopnea and PND.  He has had no peripheral edema.  He lives in [place] and moved here 4 years ago from [place] where he retired from a [employer].  He  does go to the dentist regularly and has had no problems.       Past Medical History:  Diagnosis Date   Aortic stenosis      Echo 10/18: EF 55-60, normal wall motion, grade 1 diastolic dysfunction, moderate aortic stenosis (mean 20, peak 35) // Echocardiogram 5/22: EF 65-70, mod to severe AS (mean 35 mmHg, Vmax 396 cm/s, DI 0.25), trivial AI, normal RVSF, trivial MR, mild dilation of ascending aorta (36 mm)   Arthritis      FINGER S    CAD (coronary artery disease) 2011    Tx with DES ([place], [place]); Dr. Ofilia Neas in [place], [place] // [facility] 5/11 [facility]): Mid LAD 30, proximal D1 80, RCA 50 >> PCI: 2.25 x 20 mm taxus DES to the D1   Heart murmur     History of echocardiogram      Echo 5/11 [facility]): EF 55   HLD (hyperlipidemia)     HTN (hypertension)     Hypothyroidism      hyperthyroidism >> s/p RAI treatment 2002   Malignant neoplasm of prostate ([place]) 06/01/2017   Myocardial infarction [facility]) 2011    STENT PLACED            Past Surgical History:  Procedure Laterality Date   CARDIAC CATHETERIZATION   2011    WITH STENT L DONE AT [facility]  09/24/2017    Procedure: CYSTOSCOPY;  Surgeon: Raynelle Bring, MD;  Location: [facility];  Service: Urology;  Laterality: N/A;   HERNIA REPAIR   2010    inguinal   LUMBAR LAMINECTOMY Right 2015   PROSTATE BIOPSY   04/2017   RADIOACTIVE SEED IMPLANT N/A 09/24/2017    Procedure: RADIOACTIVE SEED IMPLANT/BRACHYTHERAPY IMPLANT;  Surgeon: Raynelle Bring, MD;  Location: [facility];  Service: Urology;  Laterality: N/A;   RIGHT/LEFT HEART CATH AND CORONARY ANGIOGRAPHY N/A 04/01/2021    Procedure: RIGHT/LEFT HEART CATH AND CORONARY ANGIOGRAPHY;  Surgeon: Burnell Blanks, MD;  Location: [facility];  Service: Cardiovascular;  Laterality: N/A;   SPACE OAR INSTILLATION N/A 09/24/2017    Procedure: SPACE OAR INSTILLATION;  Surgeon: Raynelle Bring, MD;  Location: [facility];  Service: Urology;  Laterality: N/A;   TOTAL HIP ARTHROPLASTY Right 2013           Family History  Problem Relation Age of Onset   Cancer Brother          prostate   Alzheimer's disease Mother     CAD Father          CABG   Dementia Father     Heart attack Paternal Uncle        Social History         Socioeconomic History   Marital status: Single      Spouse name: Not on file   Number of children: 1   Years of education: Not on file   Highest education level: Not on file  Occupational History   Occupation: Air cabin crew for [employer]  Tobacco Use   Smoking status: Every Day      Packs/day: 0.75      Years: 55.00      Pack years: 41.25      Types: Cigarettes   Smokeless tobacco: Never  Vaping Use   Vaping Use: Never used  Substance and Sexual Activity   Alcohol use: Yes      Alcohol/week: 10.0 standard drinks      Types: 10 Cans of beer per week      Comment: 2-6 beers per day   Drug use: No   Sexual activity: Yes  Other Topics Concern   Not on file  Social History Narrative    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    Moved to [place] from [place], [place] in 6.2018    Engaged    1 son -56 yo (Master's Degree candidate in Engineer, mining at [employer])    Social Determinants of Health    Financial Resource Strain: Not on file  Food Insecurity: Not on file  Transportation Needs: Not on file  Physical Activity: Not on file  Stress: Not on file  Social Connections: Not on file  Intimate Partner Violence: Not on file             Prior to Admission medications   Medication Sig Start Date End Date Taking? Authorizing Provider  Ascorbic Acid (VITAMIN C) 250 MG CHEW Chew 250 mg by mouth daily.     Yes [provider]  aspirin EC 81 MG tablet Take 81 mg by mouth daily.     Yes [provider]  Cholecalciferol (VITAMIN D3) 50 MCG (2000 UT) CHEW Chew 2,000 Units by mouth daily.     Yes [provider]  ezetimibe  (ZETIA) 10 MG tablet Take 10 mg by mouth daily. 12/09/20   Yes [provider]  fluticasone (FLONASE) 50 MCG/ACT nasal spray Place 2 sprays into both nostrils daily as needed for allergies or rhinitis.     Yes [provider]  levothyroxine (SYNTHROID) 125 MCG tablet Take 125 mcg by mouth daily before breakfast.     Yes [provider]  lisinopril (PRINIVIL,ZESTRIL) 5 MG tablet Take 5 mg by mouth daily.     Yes [provider]  metoprolol tartrate (LOPRESSOR) 25 MG tablet Take 12.5 mg by mouth 2 (two) times daily.      Yes [provider]  Polyethylene Glycol 400 (BLINK TEARS) 0.25 % SOLN Place 1 drop into both eyes 2 (two) times daily as needed (dry eyes).     Yes [provider]  rosuvastatin (CRESTOR) 40 MG tablet Take 40 mg by mouth daily. 12/14/19   Yes [provider]  tamsulosin (FLOMAX) 0.4 MG CAPS capsule Take 1 capsule (0.4 mg total) by mouth 2 (two) times daily. Patient taking differently: Take 0.4 mg by mouth daily. 10/12/17   Yes Tyler Pita, MD  nitroGLYCERIN (NITROSTAT) 0.4 MG SL tablet Place 1 tablet (0.4 mg total) under the tongue every 5 (five) minutes as needed for chest pain. Patient not taking: Reported on 07/10/2021 10/31/19     Nahser, Wonda Cheng, MD            Current Outpatient Medications  Medication Sig Dispense Refill   Ascorbic Acid (VITAMIN C) 250 MG CHEW Chew 250 mg by mouth daily.       aspirin EC 81 MG tablet Take 81 mg by mouth daily.       Cholecalciferol (VITAMIN D3) 50 MCG (2000 UT) CHEW Chew 2,000 Units by mouth daily.       ezetimibe (ZETIA) 10 MG tablet Take 10 mg by mouth daily.       fluticasone (FLONASE) 50 MCG/ACT nasal spray Place 2 sprays into both nostrils daily as needed for allergies or rhinitis.       levothyroxine (SYNTHROID) 125 MCG tablet Take 125 mcg by mouth daily before breakfast.       lisinopril (PRINIVIL,ZESTRIL) 5 MG tablet Take 5 mg by mouth daily.       metoprolol tartrate  (LOPRESSOR) 25 MG tablet Take 12.5 mg by mouth 2 (two) times daily.        Polyethylene Glycol 400 (BLINK TEARS) 0.25 % SOLN Place 1 drop into both eyes 2 (two) times daily as needed (dry eyes).       rosuvastatin (CRESTOR) 40 MG tablet Take 40 mg by mouth daily.       tamsulosin (FLOMAX) 0.4 MG CAPS capsule Take 1 capsule (0.4 mg total) by mouth 2 (two) times daily. (Patient taking differently: Take 0.4 mg by mouth daily.) 60 capsule 5   nitroGLYCERIN (NITROSTAT) 0.4 MG SL tablet Place 1 tablet (0.4 mg total) under the tongue every 5 (five) minutes as needed for chest pain. (Patient not taking: Reported on 07/10/2021) 25 tablet 3    No current facility-administered medications for this visit.      No Known Allergies       Review of Systems:               General:                      normal appetite, + decreased energy, no weight gain, no weight loss, no fever             Cardiac:  no chest pain with exertion, no chest pain at rest, +SOB with mild exertion, no resting SOB, no PND, no orthopnea, no palpitations, no arrhythmia, no atrial fibrillation, no LE edema, no dizzy spells, no syncope             Respiratory:                 + exertional shortness of breath, no home oxygen, no productive cough, no dry cough, no bronchitis, no wheezing, no hemoptysis, no asthma, no pain with inspiration or cough, no sleep apnea, no CPAP at night             GI:                               No difficulty swallowing, no reflux, no frequent heartburn, no hiatal hernia, no abdominal pain, no constipation, no diarrhea, no hematochezia, no hematemesis, no melena             GU:                              no dysuria,  no frequency, no urinary tract infection, no hematuria, no enlarged prostate, no kidney stones, no kidney disease             Vascular:                     no pain suggestive of claudication, no pain in feet, no leg cramps, no varicose veins, no DVT, no non-healing foot ulcer              Neuro:                         no stroke, no TIA's, no seizures, no headaches, no temporary blindness one eye,  no slurred speech, no peripheral neuropathy, no chronic pain, no instability of gait, no memory/cognitive dysfunction             Musculoskeletal:         + arthritis, no joint swelling, no myalgias, no difficulty walking, normal mobility              Skin:                            no rash, no itching, no skin infections, no pressure sores or ulcerations             Psych:                         no anxiety, no depression, no nervousness, no unusual recent stress             Eyes:                           no blurry vision, + floaters, no recent vision changes, + wears glasses              ENT:                            + hearing loss, no loose or painful teeth, no dentures, last saw dentist recently  Hematologic:               no easy bruising, no abnormal bleeding, no clotting disorder, no frequent epistaxis             Endocrine:                   no diabetes, does not check CBG's at home                            Physical Exam:               BP 140/70   Pulse 70   Resp 20   Ht 5\' 7"  (1.702 m)   Wt 205 lb (93 kg)   SpO2 95% Comment: RA  BMI 32.11 kg/m              General:                      well-appearing             HEENT:                       Unremarkable, NCAT, PERLA, EOMI             Neck:                           no JVD, no bruits, no adenopathy              Chest:                          clear to auscultation, symmetrical breath sounds, no wheezes, no rhonchi              CV:                              RRR, 3/6 systolic murmur RSB, no diastolic murmur             Abdomen:                    soft, non-tender, no masses              Extremities:                 warm, well-perfused, pulses palpable at ankle, no lower extremity edema             Rectal/GU                   Deferred             Neuro:                         Grossly non-focal  and symmetrical throughout             Skin:                            Clean and dry, no rashes, no breakdown   Diagnostic Tests:   ECHOCARDIOGRAM REPORT         Patient Name:   Jie Lynann Bologna Date of Exam: 02/28/2021  Medical Rec #:  [id number]      Height:  67.0 in  Accession #:    [id number]     Weight:       208.8 lb  Date of Birth:  11-May-1951      BSA:          2.060 m  Patient Age:    53 years       BP:           124/70 mmHg  Patient Gender: M              HR:           78 bpm.  Exam Location:  [place]   Procedure: 2D Echo, Cardiac Doppler and Color Doppler   Indications:    I35.0 Aortic Stenosis     History:        Patient has prior history of Echocardiogram examinations,  most                  recent 07/14/2017. Risk Factors:Hypertension and  Dyslipidemia.                  Murmur. Hypothyroidism. Myocardial infarction. Coronary  artery                  disease.     Sonographer:    Cresenciano Lick RDCS  Referring Phys: [id number] WEAVER   IMPRESSIONS     1. The aortic valve is calcified. There is moderate-to-severe  calcification of the aortic valve. There is moderate-to-severe thickening  of the aortic valve. Aortic valve regurgitation is trivial. There is  moderate-to-severe aortic valve stenosis with  AVA 0.91cm2 by continuity, mean gradient 40mmHg, peak gradient 51mmHg,  Vmax 3.59m/s, DI 0.25. If clinically indicated/concerned for symptomatic  aortic stenosis, would recommend evaluation by structural team.   2. Left ventricular ejection fraction, by estimation, is 65 to 70%. The  left ventricle has normal function. The left ventricle has no regional  wall motion abnormalities. Left ventricular diastolic parameters are  indeterminate.   3. Right ventricular systolic function is normal. The right ventricular  size is normal. Tricuspid regurgitation signal is inadequate for assessing  PA pressure.   4. The mitral valve is normal in  structure. Trivial mitral valve  regurgitation.   5. Aortic dilatation noted. There is mild dilatation of the ascending  aorta, measuring 36 mm.   6. The inferior vena cava is normal in size with greater than 50%  respiratory variability, suggesting right atrial pressure of 3 mmHg.   FINDINGS   Left Ventricle: Left ventricular ejection fraction, by estimation, is 65  to 70%. The left ventricle has normal function. The left ventricle has no  regional wall motion abnormalities. The left ventricular internal cavity  size was normal in size. There is   borderline concentric left ventricular hypertrophy. Left ventricular  diastolic parameters are indeterminate.   Right Ventricle: The right ventricular size is normal. No increase in  right ventricular wall thickness. Right ventricular systolic function is  normal. Tricuspid regurgitation signal is inadequate for assessing PA  pressure.   Left Atrium: Left atrial size was normal in size.   Right Atrium: Right atrial size was normal in size.   Pericardium: There is no evidence of pericardial effusion.   Mitral Valve: The mitral valve is normal in structure. Trivial mitral  valve regurgitation.   Tricuspid Valve: The tricuspid valve is normal in structure. Tricuspid  valve regurgitation is not demonstrated.   Aortic Valve: The aortic valve is calcified. There  is moderate  calcification of the aortic valve. There is moderate thickening of the  aortic valve. Aortic valve regurgitation is trivial. Aortic regurgitation  PHT measures 440 msec. Moderate to severe  aortic stenosis is present.AVA 0.91cm2, mean gradient 54mmHg, peak  gradient 44mmHg, Vmax 3.46m/s, DI 0.25   Pulmonic Valve: The pulmonic valve was normal in structure. Pulmonic valve  regurgitation is trivial.   Aorta: Aortic dilatation noted. There is mild dilatation of the ascending  aorta, measuring 36 mm.   Venous: The inferior vena cava is normal in size with greater  than 50%  respiratory variability, suggesting right atrial pressure of 3 mmHg.   IAS/Shunts: No atrial level shunt detected by color flow Doppler.      LEFT VENTRICLE  PLAX 2D  LVIDd:         4.40 cm  Diastology  LVIDs:         2.50 cm  LV e' medial:    8.08 cm/s  LV PW:         1.00 cm  LV E/e' medial:  11.8  LV IVS:        1.00 cm  LV e' lateral:   10.40 cm/s  LVOT diam:     2.10 cm  LV E/e' lateral: 9.1  LV SV:         67  LV SV Index:   33  LVOT Area:     3.46 cm      RIGHT VENTRICLE             IVC  RV Basal diam:  4.20 cm     IVC diam: 1.60 cm  RV S prime:     19.50 cm/s  TAPSE (M-mode): 2.2 cm   LEFT ATRIUM             Index       RIGHT ATRIUM           Index  LA diam:        3.90 cm 1.89 cm/m  RA Area:     14.70 cm  LA Vol (A2C):   55.5 ml 26.95 ml/m RA Volume:   37.60 ml  18.26 ml/m  LA Vol (A4C):   37.6 ml 18.26 ml/m  LA Biplane Vol: 46.7 ml 22.67 ml/m   AORTIC VALVE  AV Area (Vmax):    0.93 cm  AV Area (Vmean):   0.87 cm  AV Area (VTI):     0.94 cm  AV Vmax:           372.60 cm/s  AV Vmean:          263.800 cm/s  AV VTI:            0.718 m  AV Peak Grad:      55.5 mmHg  AV Mean Grad:      31.8 mmHg  LVOT Vmax:         100.25 cm/s  LVOT Vmean:        66.200 cm/s  LVOT VTI:          0.194 m  LVOT/AV VTI ratio: 0.27  AI PHT:            440 msec     AORTA  Ao Root diam: 3.40 cm  Ao Asc diam:  3.60 cm   MITRAL VALVE  MV Area (PHT): 3.17 cm    SHUNTS  MV Decel Time: 239 msec    Systemic VTI:  0.19 m  MV  E velocity: 95.10 cm/s  Systemic Diam: 2.10 cm  MV A velocity: 79.50 cm/s  MV E/A ratio:  1.20   Gwyndolyn Kaufman MD  Electronically signed by Gwyndolyn Kaufman MD  Signature Date/Time: 02/28/2021/3:23:21 PM    Physicians   Panel Physicians Referring Physician Case Authorizing Physician  Burnell Blanks, MD (Primary)        Procedures   RIGHT/LEFT HEART CATH AND CORONARY ANGIOGRAPHY    Conclusion     Prox RCA lesion is 20%  stenosed. Prox LAD to Mid LAD lesion is 20% stenosed. Dist LAD lesion is 30% stenosed. 1st Diag lesion is 10% stenosed.   1. Patent Diagonal stent 2. Mild non-obstructive plaque in the LAD and RCA 3. Likely severe aortic stenosis by echo (cath data with mean gradient 23 mmHg, peak gradient 25 mm Hg)   Recommendations: Will continue workup for potential TAVR. His aortic valve appears to be severely stenotic by echo imaging. Echo data supports moderate to severe AS. Cath data supports moderate AS. Given his symptoms, will plan cardiac CT to better assess his valve.    Indications   Nonrheumatic aortic valve stenosis [I35.0 (ICD-10-CM)]    Procedural Details   Technical Details Indication: Severe aortic stenosis, TAVR workup.   Procedure: The risks, benefits, complications, treatment options, and expected outcomes were discussed with the patient. The patient and/or family concurred with the proposed plan, giving informed consent. The patient was brought to the cath lab after IV hydration was given. The patient was sedated with Versed and Fentanyl. The IV catheter present in the right antecubital vein was changed for a 5 [place] sheath. Right heart catheterization performed with a balloon tipped catheter. The right wrist was prepped and draped in a sterile fashion. 1% lidocaine was used for local anesthesia. Using the modified Seldinger access technique, a 5 French sheath was placed in the right radial artery. 3 mg Verapamil was given through the sheath. 5000 units IV heparin was given. Standard diagnostic catheters were used to perform selective coronary angiography. The aortic valve was crossed with a JR4 catheter and the J wire. LV pressures measured. No LV gram. The sheath was removed from the right radial artery and a Terumo hemostasis band was applied at the arteriotomy site on the right wrist.    Estimated blood loss <50 mL.   During this procedure medications were administered to achieve  and maintain moderate conscious sedation while the patient's heart rate, blood pressure, and oxygen saturation were continuously monitored and I was present face-to-face 100% of this time.    Medications (Filter: Administrations occurring from [phone] to 0950 on 04/01/21)  important  Continuous medications are totaled by the amount administered until 04/01/21 0950.    midazolam (VERSED) injection (mg) Total dose:  2 mg Date/Time Rate/Dose/Volume Action    04/01/21 0909 2 mg Given      fentaNYL (SUBLIMAZE) injection (mcg) Total dose:  50 mcg Date/Time Rate/Dose/Volume Action    04/01/21 0909 50 mcg Given      Heparin (Porcine) in NaCl 1000-0.9 UT/500ML-% SOLN (mL) Total volume:  1,000 mL Date/Time Rate/Dose/Volume Action    04/01/21 0909 500 mL Given    0909 500 mL Given      lidocaine (PF) (XYLOCAINE) 1 % injection (mL) Total volume:  2 mL Date/Time Rate/Dose/Volume Action    04/01/21 Canceled Entry    0915 2 mL Given      Radial Cocktail/Verapamil only (mL) Total volume:  10 mL Date/Time Rate/Dose/Volume Action  04/01/21 0921 10 mL Given      heparin sodium (porcine) injection (Units) Total dose:  5,000 Units Date/Time Rate/Dose/Volume Action    04/01/21 0927 5,000 Units Given      iohexol (OMNIPAQUE) 350 MG/ML injection (mL) Total volume:  50 mL Date/Time Rate/Dose/Volume Action    04/01/21 0941 50 mL Given      Sedation Time   Sedation Time Physician-1: 26 minutes 42 seconds Contrast   Medication Name Total Dose  iohexol (OMNIPAQUE) 350 MG/ML injection 50 mL    Radiation/Fluoro   Fluoro time: 4.3 (min) DAP: 13.9 (Gycm2) Cumulative Air Kerma: 341.9 (mGy) Complications      Complications documented before study signed (04/01/2021  9:50 AM)     RIGHT/LEFT HEART CATH AND CORONARY ANGIOGRAPHY   None Documented by Burnell Blanks, MD 04/01/2021  9:43 AM  Date Found: 04/01/2021  Time Range: Intraprocedure          Coronary Findings    Diagnostic Dominance: Right Left Anterior Descending  Prox LAD to Mid LAD lesion is 20% stenosed.  Dist LAD lesion is 30% stenosed.  First Diagonal Branch  Vessel is moderate in size.  1st Diag lesion is 10% stenosed. The lesion was previously treated using a stent (unknown type) over 2 years ago.  Left Circumflex  Vessel is large.  Right Coronary Artery  Vessel is large.  Prox RCA lesion is 20% stenosed.  Intervention   No interventions have been documented. Coronary Diagrams   Diagnostic Dominance: Right Intervention   Implants      No implant documentation for this case.    Syngo Images    Show images for CARDIAC CATHETERIZATION Images on Long Term Storage    Show images for Saahil, [place] to Procedure Log   Procedure Log    Hemo Data   Flowsheet Row Most Recent Value  Fick Cardiac Output 4.97 L/min  Fick Cardiac Output Index 2.43 (L/min)/BSA  Aortic Mean Gradient 23 mmHg  Aortic Peak Gradient 25 mmHg  Aortic Valve Area 1.35  Aortic Value Area Index 0.66 cm2/BSA  RA A Wave 11 mmHg  RA V Wave 6 mmHg  RA Mean 7 mmHg  RV Systolic Pressure 33 mmHg  RV Diastolic Pressure 6 mmHg  RV EDP 10 mmHg  PA Systolic Pressure 31 mmHg  PA Diastolic Pressure 8 mmHg  PA Mean 20 mmHg  PW A Wave 14 mmHg  PW V Wave 16 mmHg  PW Mean 12 mmHg  AO Systolic Pressure 379 mmHg  AO Diastolic Pressure 59 mmHg  AO Mean 77 mmHg  LV Systolic Pressure 024 mmHg  LV Diastolic Pressure 3 mmHg  LV EDP 16 mmHg  AOp Systolic Pressure 097 mmHg  AOp Diastolic Pressure 58 mmHg  AOp Mean Pressure 79 mmHg  LVp Systolic Pressure 353 mmHg  LVp Diastolic Pressure 7 mmHg  LVp EDP Pressure 17 mmHg  QP/QS 1  TPVR Index 8.22 HRUI  TSVR Index 31.66 HRUI  PVR SVR Ratio 0.11  TPVR/TSVR Ratio 0.26      ADDENDUM REPORT: 04/16/2021 20:23   CLINICAL DATA:  Severe Aortic Stenosis.   EXAM: Cardiac TAVR CT   TECHNIQUE: A non-contrast, gated CT scan was obtained with axial slices of 3  mm through the heart for aortic valve calcium scoring. A 110 kV retrospective, gated, contrast cardiac scan was obtained. Gantry rotation speed was 250 msecs and collimation was 0.6 mm. Nitroglycerin was not given. The 3D data set was reconstructed in 5% intervals  of the 0-95% of the R-R cycle. Systolic and diastolic phases were analyzed on a dedicated workstation using MPR, MIP, and VRT modes. The patient received 100 cc of contrast.   FINDINGS: Image quality: Excellent.   Noise artifact is: Limited.   Valve Morphology: The aortic valve is severely calcified with restricted leaflet motion in systole. There is severe bulky calcification on the RCC. There in incomplete leaflet coaptation noted.   Aortic Valve Calcium score: 1957   Aortic annular dimension:   Phase assessed: 20%   Annular area: 518 mm2   Annular perimeter: 82.1 mm   Max diameter: 29.6 mm   Min diameter: 23.6 mm   Annular and subannular calcification: None.   Optimal coplanar projection: LAO 23 CAU 6   Coronary Artery Height above Annulus:   Left Main: 15.9 mm   Right Coronary: 20.1 mm   Sinus of Valsalva Measurements:   Non-coronary: 33 mm   Right-coronary: 32 mm   Left-coronary: 35 mm   Sinus of Valsalva Height:   Non-coronary: 24.0 mm   Right-coronary: 20.1 mm   Left-coronary: 21.6 mm   Sinotubular Junction: 28 mm   Ascending Thoracic Aorta: 33 mm   Coronary Arteries: Normal coronary origin. Right dominance. The study was performed without use of NTG and is insufficient for plaque evaluation. Please refer to recent cardiac catheterization for coronary assessment. 3-vessel calcifications noted.   Cardiac Morphology:   Right Atrium: Right atrial size is within normal limits.   Right Ventricle: The right ventricular cavity is within normal limits.   Left Atrium: Left atrial size is normal in size with no left atrial appendage filling defect.   Left Ventricle: The ventricular  cavity size is within normal limits. There are no stigmata of prior infarction. There is no abnormal filling defect. Normal left ventricular function, EF=66%. No regional wall motion abnormalities.   Pulmonary arteries: Normal in size without proximal filling defect.   Pulmonary veins: Normal pulmonary venous drainage.   Pericardium: Normal thickness with no significant effusion or calcium present.   Mitral Valve: The mitral valve is normal structure without significant calcification.   Extra-cardiac findings: See attached radiology report for non-cardiac structures.   IMPRESSION: 1. Severely calcified, tricuspid aortic valve with restricted leaflet motion. Aortic valve calcium score 1957.   2. Annular measurements appropriate for 26 mm S3 (518 mm2).   3. Sufficient coronary to annulus distance.   4. Optimal Fluoroscopic Angle for Delivery: LAO 23 CAU 6   [place] T. Audie Box, MD     Electronically Signed   By: Eleonore Chiquito   On: 04/16/2021 20:23    Addended by Geralynn Rile, MD on 04/16/2021  8:25 PM    Study Result   Narrative & Impression  EXAM: OVER-READ INTERPRETATION  CT CHEST   The following report is an over-read performed by radiologist Dr. Vinnie Langton of [facility], [place] on 04/16/2021. This over-read does not include interpretation of cardiac or coronary anatomy or pathology. The coronary calcium score/coronary CTA interpretation by the cardiologist is attached.   COMPARISON:  None.   FINDINGS: Extracardiac findings will be described separately under dictation for contemporaneously obtained CTA chest, abdomen and pelvis dated 04/16/2021.   IMPRESSION: Please see separate dictation for contemporaneously obtained CTA chest, abdomen and pelvis 04/16/2021 for full description of relevant extracardiac findings.   Electronically Signed: By: Vinnie Langton M.D. On: 04/16/2021 11:18        Narrative & Impression  CLINICAL DATA:   70 year old male  with history of severe aortic stenosis. Preprocedural study prior to potential transcatheter aortic valve replacement (TAVR) procedure.   EXAM: CT ANGIOGRAPHY CHEST, ABDOMEN AND PELVIS   TECHNIQUE: Multidetector CT imaging through the chest, abdomen and pelvis was performed using the standard protocol during bolus administration of intravenous contrast. Multiplanar reconstructed images and MIPs were obtained and reviewed to evaluate the vascular anatomy.   CONTRAST:  117mL OMNIPAQUE IOHEXOL 350 MG/ML SOLN   COMPARISON:  No priors.   FINDINGS: CTA CHEST FINDINGS   Cardiovascular: Heart size is normal. There is no significant pericardial fluid, thickening or pericardial calcification. There is aortic atherosclerosis, as well as atherosclerosis of the great vessels of the mediastinum and the coronary arteries, including calcified atherosclerotic plaque in the left main, left anterior descending, left circumflex and right coronary arteries. Severe thickening and calcification of the aortic valve.   Mediastinum/Lymph Nodes: No pathologically enlarged mediastinal or hilar lymph nodes. Please note that accurate exclusion of hilar adenopathy is limited on noncontrast CT scans. Esophagus is unremarkable in appearance. No axillary lymphadenopathy.   Lungs/Pleura: No acute consolidative airspace disease. No pleural effusions. No suspicious appearing pulmonary nodules or masses are noted.   Musculoskeletal/Soft Tissues: There are no aggressive appearing lytic or blastic lesions noted in the visualized portions of the skeleton.   CTA ABDOMEN AND PELVIS FINDINGS   Hepatobiliary: No suspicious cystic or solid hepatic lesions. No intra or extrahepatic biliary ductal dilatation. Gallbladder is normal in appearance.   Pancreas: No pancreatic mass. No pancreatic ductal dilatation. No pancreatic or peripancreatic fluid collections or inflammatory changes.   Spleen:  Unremarkable.   Adrenals/Urinary Tract: Exophytic 1.8 cm low-attenuation lesion in the upper pole of the left kidney, compatible with a simple cyst. Right kidney and bilateral adrenal glands are normal in appearance. No hydroureteronephrosis. Urinary bladder is normal in appearance.   Stomach/Bowel: Normal appearance of the stomach. No pathologic dilatation of small bowel or colon. Numerous colonic diverticulae are noted, particularly in the descending colon and sigmoid colon, without surrounding inflammatory changes to suggest an acute diverticulitis at this time. Normal appendix.   Vascular/Lymphatic: Vascular findings and measurements pertinent to potential TAVR procedure, as detailed below. No aneurysm or dissection noted in the abdominal or pelvic vasculature. No lymphadenopathy noted in the abdomen or pelvis.   Reproductive: Brachytherapy implants within the prostate gland. Seminal vesicles are unremarkable in appearance.   Other: No significant volume of ascites.  No pneumoperitoneum.   Musculoskeletal: Status post right hip arthroplasty. Status post laminectomy at L4-L5. There are no aggressive appearing lytic or blastic lesions noted in the visualized portions of the skeleton.   VASCULAR MEASUREMENTS PERTINENT TO TAVR:   AORTA:   Minimal Aortic Diameter-11 x 11 mm   Severity of Aortic Calcification-severe   RIGHT PELVIS:   Right Common Iliac Artery -   Minimal Diameter-6.3 x 7.2 mm   Tortuosity-mild   Calcification-moderate   Right External Iliac Artery -   Minimal Diameter-6.5 x 6.1 mm   Tortuosity-moderate   Calcification-mild   Right Common Femoral Artery -   Minimal Diameter-7.3 x 7.7 mm   Tortuosity-mild   Calcification-mild-to-moderate   LEFT PELVIS:   Left Common Iliac Artery -   Minimal Diameter-6.5 x 5.1 mm   Tortuosity-mild   Calcification-moderate   Left External Iliac Artery -   Minimal Diameter-7.5 x 5.2 mm    Tortuosity-moderate   Calcification-mild   Left Common Femoral Artery -   Minimal Diameter-7.2 x 6.8 mm   Tortuosity-mild  Calcification-mild   Review of the MIP images confirms the above findings.   IMPRESSION: 1. Vascular findings and measurements pertinent to potential TAVR procedure, as detailed above. 2. Severe thickening and calcification of the aortic valve, compatible with reported clinical history of severe aortic stenosis. 3. Aortic atherosclerosis, in addition to left main and 3 vessel coronary artery disease. 4. Severe colonic diverticulosis without evidence of acute diverticulitis at this time 5. Additional incidental findings, as above.     Electronically Signed   By: Vinnie Langton M.D.   On: 04/16/2021 12:07      STS Risk Score: AVR + CABG Risk of Mortality: 0.931% Renal Failure: 1.293% Permanent Stroke: 1.056% Prolonged Ventilation: 5.483% DSW Infection: 0.237% Reoperation: 2.899% Morbidity or Mortality: 9.079% Short Length of Stay: 57.568% Long Length of Stay: 3.121%   Impression:   This 70 year old gentleman has stage D3, severe, symptomatic aortic stenosis with New York Heart Association class II symptoms of exertional fatigue and shortness of breath consistent with chronic diastolic congestive heart failure.  I have personally reviewed his 2D echocardiogram, cardiac catheterization, and CTA studies.  His echocardiogram showed a heavily calcified aortic valve with leaflet thickening and restricted mobility with a mean gradient of 35 mmHg and a peak gradient of 63 mmHg.  Peak velocity was 3.96 m/s.  Dimensionless index was 0.25 with a valve area of 0.91 cm consistent with severe aortic stenosis.  Left ventricular function was preserved.  Cardiac catheterization showed a patent diagonal stent and otherwise mild nonobstructive disease in the LAD and RCA.  The mean gradient was measured at 23 mmHg.  I agree that aortic valve replacement is  indicated in this patient for relief of his symptoms and to prevent progressive left ventricular deterioration.  He is only 70 years old but he is a ongoing heavy smoker with significant arthritis and I think transcatheter aortic valve replacement is a reasonable alternative for him.  His gated cardiac CTA shows a heavily calcified aortic valve with restricted opening.  Anatomy appears suitable for a 26 mm SAPIEN 3 valve.  His abdominal and pelvic CTA shows adequate pelvic vascular anatomy to allow transfemoral insertion.   The patient was counseled at length regarding treatment alternatives for management of severe symptomatic aortic stenosis. The risks and benefits of surgical intervention has been discussed in detail. Long-term prognosis with medical therapy was discussed. Alternative approaches such as conventional surgical aortic valve replacement, transcatheter aortic valve replacement, and palliative medical therapy were compared and contrasted at length. This discussion was placed in the context of the patient's own specific clinical presentation and past medical history. All of his questions have been addressed.    Following the decision to proceed with transcatheter aortic valve replacement, a discussion was held regarding what types of management strategies would be attempted intraoperatively in the event of life-threatening complications, including whether or not the patient would be considered a candidate for the use of cardiopulmonary bypass and/or conversion to open sternotomy for attempted surgical intervention.  I think he would be a candidate for emergent sternotomy to manage any intraoperative complications the patient is aware of the fact that transient use of cardiopulmonary bypass may be necessary. The patient has been advised of a variety of complications that might develop including but not limited to risks of death, stroke, paravalvular leak, aortic dissection or other major vascular  complications, aortic annulus rupture, device embolization, cardiac rupture or perforation, mitral regurgitation, acute myocardial infarction, arrhythmia, heart block or bradycardia requiring permanent pacemaker placement,  congestive heart failure, respiratory failure, renal failure, pneumonia, infection, other late complications related to structural valve deterioration or migration, or other complications that might ultimately cause a temporary or permanent loss of functional independence or other long term morbidity. The patient provides full informed consent for the procedure as described and all questions were answered.       Plan:   Transfemoral transcatheter aortic valve replacement.    Gaye Pollack, MD

## 2021-07-15 NOTE — Anesthesia Preprocedure Evaluation (Addendum)
Anesthesia Evaluation  Patient identified by MRN, date of birth, ID band Patient awake    Reviewed: Allergy & Precautions, NPO status , Patient's Chart, lab work & pertinent test results  Airway Mallampati: II  TM Distance: >3 FB Neck ROM: Full    Dental   Pulmonary Current Smoker and Patient abstained from smoking.,    breath sounds clear to auscultation       Cardiovascular hypertension, Pt. on medications and Pt. on home beta blockers + CAD  + Valvular Problems/Murmurs AS  Rhythm:Regular Rate:Normal + Systolic murmurs    Neuro/Psych negative neurological ROS     GI/Hepatic negative GI ROS, Neg liver ROS,   Endo/Other  Hypothyroidism   Renal/GU negative Renal ROS     Musculoskeletal  (+) Arthritis ,   Abdominal   Peds  Hematology negative hematology ROS (+)   Anesthesia Other Findings   Reproductive/Obstetrics                            Anesthesia Physical Anesthesia Plan  ASA: 4  Anesthesia Plan: MAC   Post-op Pain Management:    Induction:   PONV Risk Score and Plan: 0 and Propofol infusion and Treatment may vary due to age or medical condition  Airway Management Planned: Natural Airway and Simple Face Mask  Additional Equipment:   Intra-op Plan:   Post-operative Plan:   Informed Consent: I have reviewed the patients History and Physical, chart, labs and discussed the procedure including the risks, benefits and alternatives for the proposed anesthesia with the patient or authorized representative who has indicated his/her understanding and acceptance.       Plan Discussed with:   Anesthesia Plan Comments: ( )       Anesthesia Quick Evaluation

## 2021-07-16 ENCOUNTER — Other Ambulatory Visit: Payer: Self-pay

## 2021-07-16 ENCOUNTER — Inpatient Hospital Stay (HOSPITAL_COMMUNITY): Payer: Medicare Other | Admitting: Anesthesiology

## 2021-07-16 ENCOUNTER — Encounter (HOSPITAL_COMMUNITY)
Admission: RE | Disposition: A | Discharge status: Home / Self Care | Payer: Self-pay | Source: Ambulatory Visit | Attending: Internal Medicine

## 2021-07-16 ENCOUNTER — Inpatient Hospital Stay (HOSPITAL_COMMUNITY)
Admission: RE | Admit: 2021-07-16 | Discharge: 2021-07-17 | DRG: 267 | Disposition: A | Discharge status: Home / Self Care | Payer: Medicare Other | Source: Ambulatory Visit | Attending: Internal Medicine | Admitting: Internal Medicine

## 2021-07-16 ENCOUNTER — Other Ambulatory Visit: Payer: Self-pay | Admitting: Cardiology

## 2021-07-16 ENCOUNTER — Encounter (HOSPITAL_COMMUNITY): Payer: Self-pay | Admitting: Cardiovascular Disease

## 2021-07-16 ENCOUNTER — Ambulatory Visit (HOSPITAL_COMMUNITY)
Admission: RE | Admit: 2021-07-16 | Discharge: 2021-07-16 | Disposition: A | Discharge status: Home / Self Care | Payer: Medicare Other | Source: Ambulatory Visit | Attending: Physician Assistant | Admitting: Physician Assistant

## 2021-07-16 DIAGNOSIS — I350 Nonrheumatic aortic (valve) stenosis: Secondary | ICD-10-CM | POA: Diagnosis not present

## 2021-07-16 DIAGNOSIS — Z955 Presence of coronary angioplasty implant and graft: Secondary | ICD-10-CM | POA: Diagnosis not present

## 2021-07-16 DIAGNOSIS — Z79899 Other long term (current) drug therapy: Secondary | ICD-10-CM | POA: Diagnosis not present

## 2021-07-16 DIAGNOSIS — E785 Hyperlipidemia, unspecified: Secondary | ICD-10-CM | POA: Insufficient documentation

## 2021-07-16 DIAGNOSIS — I252 Old myocardial infarction: Secondary | ICD-10-CM | POA: Insufficient documentation

## 2021-07-16 DIAGNOSIS — T82857A Stenosis of cardiac prosthetic devices, implants and grafts, initial encounter: Secondary | ICD-10-CM | POA: Diagnosis not present

## 2021-07-16 DIAGNOSIS — Z006 Encounter for examination for normal comparison and control in clinical research program: Secondary | ICD-10-CM | POA: Diagnosis not present

## 2021-07-16 DIAGNOSIS — I119 Hypertensive heart disease without heart failure: Secondary | ICD-10-CM | POA: Insufficient documentation

## 2021-07-16 DIAGNOSIS — Z952 Presence of prosthetic heart valve: Secondary | ICD-10-CM

## 2021-07-16 DIAGNOSIS — E890 Postprocedural hypothyroidism: Secondary | ICD-10-CM | POA: Diagnosis present

## 2021-07-16 DIAGNOSIS — Z809 Family history of malignant neoplasm, unspecified: Secondary | ICD-10-CM

## 2021-07-16 DIAGNOSIS — I5032 Chronic diastolic (congestive) heart failure: Secondary | ICD-10-CM | POA: Diagnosis not present

## 2021-07-16 DIAGNOSIS — F17210 Nicotine dependence, cigarettes, uncomplicated: Secondary | ICD-10-CM | POA: Diagnosis present

## 2021-07-16 DIAGNOSIS — I10 Essential (primary) hypertension: Secondary | ICD-10-CM | POA: Diagnosis not present

## 2021-07-16 DIAGNOSIS — Z96641 Presence of right artificial hip joint: Secondary | ICD-10-CM | POA: Diagnosis not present

## 2021-07-16 DIAGNOSIS — I352 Nonrheumatic aortic (valve) stenosis with insufficiency: Secondary | ICD-10-CM | POA: Insufficient documentation

## 2021-07-16 DIAGNOSIS — I2510 Atherosclerotic heart disease of native coronary artery without angina pectoris: Secondary | ICD-10-CM | POA: Diagnosis present

## 2021-07-16 DIAGNOSIS — E079 Disorder of thyroid, unspecified: Secondary | ICD-10-CM | POA: Diagnosis not present

## 2021-07-16 DIAGNOSIS — R011 Cardiac murmur, unspecified: Secondary | ICD-10-CM | POA: Insufficient documentation

## 2021-07-16 DIAGNOSIS — Z8546 Personal history of malignant neoplasm of prostate: Secondary | ICD-10-CM | POA: Diagnosis not present

## 2021-07-16 DIAGNOSIS — Z7982 Long term (current) use of aspirin: Secondary | ICD-10-CM | POA: Diagnosis not present

## 2021-07-16 DIAGNOSIS — Z79890 Hormone replacement therapy (postmenopausal): Secondary | ICD-10-CM

## 2021-07-16 DIAGNOSIS — I110 Hypertensive heart disease with heart failure: Secondary | ICD-10-CM | POA: Diagnosis not present

## 2021-07-16 DIAGNOSIS — C61 Malignant neoplasm of prostate: Secondary | ICD-10-CM | POA: Diagnosis present

## 2021-07-16 DIAGNOSIS — Z8249 Family history of ischemic heart disease and other diseases of the circulatory system: Secondary | ICD-10-CM | POA: Diagnosis not present

## 2021-07-16 HISTORY — PX: TRANSCATHETER AORTIC VALVE REPLACEMENT, TRANSFEMORAL: SHX6400

## 2021-07-16 HISTORY — PX: TEE WITHOUT CARDIOVERSION: SHX5443

## 2021-07-16 LAB — POCT I-STAT, CHEM 8
BUN: 12 mg/dL (ref 8–23)
Calcium, Ion: 1.25 mmol/L (ref 1.15–1.40)
Chloride: 107 mmol/L (ref 98–111)
Creatinine, Ser: 0.9 mg/dL (ref 0.61–1.24)
Glucose, Bld: 102 mg/dL — ABNORMAL HIGH (ref 70–99)
HCT: 36 % — ABNORMAL LOW (ref 39.0–52.0)
Hemoglobin: 12.2 g/dL — ABNORMAL LOW (ref 13.0–17.0)
Potassium: 4.3 mmol/L (ref 3.5–5.1)
Sodium: 141 mmol/L (ref 135–145)
TCO2: 22 mmol/L (ref 22–32)

## 2021-07-16 LAB — ABO/RH: ABO/RH(D): A POS

## 2021-07-16 LAB — ECHOCARDIOGRAM LIMITED
AR max vel: 3.03 cm2
AV Area VTI: 3.64 cm2
AV Area mean vel: 3.07 cm2
AV Mean grad: 2 mmHg
AV Peak grad: 4.8 mmHg
Ao pk vel: 1.09 m/s

## 2021-07-16 SURGERY — IMPLANTATION, AORTIC VALVE, TRANSCATHETER, FEMORAL APPROACH
Anesthesia: Monitor Anesthesia Care | Site: Chest

## 2021-07-16 MED ORDER — MIDAZOLAM HCL 2 MG/2ML IJ SOLN
INTRAMUSCULAR | Status: AC
  Filled 2021-07-16: qty 2

## 2021-07-16 MED ORDER — CHLORHEXIDINE GLUCONATE 0.12 % MT SOLN: 15.0000 mL | Freq: Once | OROMUCOSAL | Status: DC

## 2021-07-16 MED ORDER — HEPARIN 6000 UNIT IRRIGATION SOLUTION
Status: AC
  Filled 2021-07-16: qty 1500

## 2021-07-16 MED ORDER — FENTANYL CITRATE (PF) 250 MCG/5ML IJ SOLN
INTRAMUSCULAR | Status: AC
  Filled 2021-07-16: qty 5

## 2021-07-16 MED ORDER — EPHEDRINE 5 MG/ML INJ
INTRAVENOUS | Status: AC
  Filled 2021-07-16: qty 10

## 2021-07-16 MED ORDER — PROTAMINE SULFATE 10 MG/ML IV SOLN
INTRAVENOUS | Status: AC
  Filled 2021-07-16: qty 25

## 2021-07-16 MED ORDER — EPHEDRINE SULFATE-NACL 50-0.9 MG/10ML-% IV SOSY
PREFILLED_SYRINGE | INTRAVENOUS | Status: DC | PRN
  Administered 2021-07-16: 5 mg via INTRAVENOUS

## 2021-07-16 MED ORDER — SODIUM CHLORIDE 0.9 % IV SOLN
250.0000 mL | INTRAVENOUS | Status: DC | PRN
Start: 2021-07-16 — End: 2021-07-17

## 2021-07-16 MED ORDER — LEVOTHYROXINE SODIUM 25 MCG PO TABS
125.0000 ug | ORAL_TABLET | Freq: Every day | ORAL | Status: DC
  Administered 2021-07-17: 125 ug via ORAL
  Filled 2021-07-16: qty 1

## 2021-07-16 MED ORDER — PROPOFOL 10 MG/ML IV BOLUS
INTRAVENOUS | Status: DC | PRN
  Administered 2021-07-16: 15 mg via INTRAVENOUS

## 2021-07-16 MED ORDER — NITROGLYCERIN IN D5W 200-5 MCG/ML-% IV SOLN: 0.0000 ug/min | INTRAVENOUS | Status: DC

## 2021-07-16 MED ORDER — ONDANSETRON HCL 4 MG/2ML IJ SOLN: 4.0000 mg | Freq: Four times a day (QID) | INTRAMUSCULAR | Status: DC | PRN

## 2021-07-16 MED ORDER — LIDOCAINE HCL (PF) 1 % IJ SOLN
INTRAMUSCULAR | Status: AC
  Filled 2021-07-16: qty 30

## 2021-07-16 MED ORDER — TRAMADOL HCL 50 MG PO TABS: 50.0000 mg | ORAL_TABLET | ORAL | Status: DC | PRN

## 2021-07-16 MED ORDER — CHLORHEXIDINE GLUCONATE 0.12 % MT SOLN
15.0000 mL | Freq: Once | OROMUCOSAL | Status: AC
  Administered 2021-07-16: 15 mL via OROMUCOSAL
  Filled 2021-07-16: qty 15

## 2021-07-16 MED ORDER — CHLORHEXIDINE GLUCONATE 4 % EX LIQD: 30.0000 mL | CUTANEOUS | Status: DC

## 2021-07-16 MED ORDER — SODIUM CHLORIDE 0.9% FLUSH
3.0000 mL | Freq: Two times a day (BID) | INTRAVENOUS | Status: DC
Start: 2021-07-16 — End: 2021-07-17
  Administered 2021-07-17 (×2): 3 mL via INTRAVENOUS

## 2021-07-16 MED ORDER — PHENYLEPHRINE HCL-NACL 20-0.9 MG/250ML-% IV SOLN
INTRAVENOUS | Status: DC | PRN
  Administered 2021-07-16: 50 ug/min via INTRAVENOUS

## 2021-07-16 MED ORDER — CHLORHEXIDINE GLUCONATE 4 % EX LIQD: 60.0000 mL | Freq: Once | CUTANEOUS | Status: DC

## 2021-07-16 MED ORDER — SODIUM CHLORIDE 0.9 % IV SOLN: 250.0000 mL | INTRAVENOUS | Status: DC

## 2021-07-16 MED ORDER — LACTATED RINGERS IV SOLN: INTRAVENOUS | Status: DC

## 2021-07-16 MED ORDER — ACETAMINOPHEN 325 MG PO TABS: 650.0000 mg | ORAL_TABLET | Freq: Four times a day (QID) | ORAL | Status: DC | PRN

## 2021-07-16 MED ORDER — PHENYLEPHRINE HCL-NACL 20-0.9 MG/250ML-% IV SOLN
25.0000 ug/min | INTRAVENOUS | Status: DC
  Filled 2021-07-16: qty 250

## 2021-07-16 MED ORDER — SODIUM CHLORIDE 0.9 % IV SOLN: INTRAVENOUS | Status: DC

## 2021-07-16 MED ORDER — NOREPINEPHRINE 4 MG/250ML-% IV SOLN
2.0000 ug/min | INTRAVENOUS | Status: DC
  Filled 2021-07-16: qty 250

## 2021-07-16 MED ORDER — HEPARIN SODIUM (PORCINE) 1000 UNIT/ML IJ SOLN
INTRAMUSCULAR | Status: AC
  Filled 2021-07-16: qty 1

## 2021-07-16 MED ORDER — CHLORHEXIDINE GLUCONATE CLOTH 2 % EX PADS
6.0000 | MEDICATED_PAD | Freq: Every day | CUTANEOUS | Status: DC
  Administered 2021-07-17: 6 via TOPICAL

## 2021-07-16 MED ORDER — DIPHENHYDRAMINE HCL 50 MG/ML IJ SOLN
INTRAMUSCULAR | Status: AC
  Filled 2021-07-16: qty 2

## 2021-07-16 MED ORDER — ACETAMINOPHEN 650 MG RE SUPP: 650.0000 mg | Freq: Four times a day (QID) | RECTAL | Status: DC | PRN

## 2021-07-16 MED ORDER — MORPHINE SULFATE (PF) 2 MG/ML IV SOLN: 1.0000 mg | INTRAVENOUS | Status: DC | PRN

## 2021-07-16 MED ORDER — DIPHENHYDRAMINE HCL 50 MG/ML IJ SOLN
INTRAMUSCULAR | Status: DC | PRN
  Administered 2021-07-16: 12.5 mg via INTRAVENOUS

## 2021-07-16 MED ORDER — PHENYLEPHRINE HCL-NACL 20-0.9 MG/250ML-% IV SOLN
0.0000 ug/min | INTRAVENOUS | Status: DC
  Filled 2021-07-16: qty 250

## 2021-07-16 MED ORDER — MUPIROCIN 2 % EX OINT
1.0000 "application " | TOPICAL_OINTMENT | Freq: Two times a day (BID) | CUTANEOUS | Status: DC
  Administered 2021-07-16 – 2021-07-17 (×2): 1 via NASAL
  Filled 2021-07-16: qty 22

## 2021-07-16 MED ORDER — CEFAZOLIN SODIUM-DEXTROSE 2-4 GM/100ML-% IV SOLN
2.0000 g | Freq: Three times a day (TID) | INTRAVENOUS | Status: AC
  Administered 2021-07-16 – 2021-07-17 (×2): 2 g via INTRAVENOUS
  Filled 2021-07-16 (×2): qty 100

## 2021-07-16 MED ORDER — PROTAMINE SULFATE 10 MG/ML IV SOLN
INTRAVENOUS | Status: DC | PRN
  Administered 2021-07-16: 40 mg via INTRAVENOUS
  Administered 2021-07-16: 50 mg via INTRAVENOUS
  Administered 2021-07-16: 10 mg via INTRAVENOUS
  Administered 2021-07-16: 50 mg via INTRAVENOUS

## 2021-07-16 MED ORDER — IODIXANOL 320 MG/ML IV SOLN
INTRAVENOUS | Status: DC | PRN
  Administered 2021-07-16: 110 mL

## 2021-07-16 MED ORDER — LIDOCAINE HCL 1 % IJ SOLN
INTRAMUSCULAR | Status: DC | PRN
  Administered 2021-07-16: 15 mL

## 2021-07-16 MED ORDER — OXYCODONE HCL 5 MG PO TABS: 5.0000 mg | ORAL_TABLET | ORAL | Status: DC | PRN

## 2021-07-16 MED ORDER — ORAL CARE MOUTH RINSE: 15.0000 mL | Freq: Once | OROMUCOSAL | Status: DC

## 2021-07-16 MED ORDER — ASPIRIN 81 MG PO CHEW
81.0000 mg | CHEWABLE_TABLET | Freq: Every day | ORAL | Status: DC
  Administered 2021-07-17: 81 mg via ORAL
  Filled 2021-07-16: qty 1

## 2021-07-16 MED ORDER — HEPARIN SODIUM (PORCINE) 1000 UNIT/ML IJ SOLN
INTRAMUSCULAR | Status: DC | PRN
  Administered 2021-07-16: 15000 [IU] via INTRAVENOUS

## 2021-07-16 MED ORDER — SODIUM CHLORIDE 0.9% FLUSH
3.0000 mL | INTRAVENOUS | Status: DC | PRN
Start: 2021-07-16 — End: 2021-07-17

## 2021-07-16 MED ORDER — PROPOFOL 500 MG/50ML IV EMUL
INTRAVENOUS | Status: DC | PRN
  Administered 2021-07-16: 25 ug/kg/min via INTRAVENOUS

## 2021-07-16 MED ORDER — FENTANYL CITRATE (PF) 250 MCG/5ML IJ SOLN
INTRAMUSCULAR | Status: DC | PRN
  Administered 2021-07-16 (×2): 50 ug via INTRAVENOUS

## 2021-07-16 MED ORDER — SODIUM CHLORIDE 0.9 % IV SOLN: INTRAVENOUS | Status: AC

## 2021-07-16 MED ORDER — HEPARIN 5000 UNITS IN NS 1000 ML (FLUSH)
INTRAMUSCULAR | Status: DC | PRN
  Administered 2021-07-16 (×3): 500 mL via INTRAMUSCULAR

## 2021-07-16 MED ORDER — TAMSULOSIN HCL 0.4 MG PO CAPS
0.4000 mg | ORAL_CAPSULE | Freq: Two times a day (BID) | ORAL | Status: DC
  Administered 2021-07-16 – 2021-07-17 (×2): 0.4 mg via ORAL
  Filled 2021-07-16 (×2): qty 1

## 2021-07-16 SURGICAL SUPPLY — 85 items
BAG COUNTER SPONGE SURGICOUNT (BAG) ×3 IMPLANT
BAG DECANTER FOR FLEXI CONT (MISCELLANEOUS) IMPLANT
BAG SNAP BAND KOVER 36X36 (MISCELLANEOUS) ×3 IMPLANT
BLADE CLIPPER SURG (BLADE) IMPLANT
BLADE OSCILLATING /SAGITTAL (BLADE) IMPLANT
BLADE STERNUM SYSTEM 6 (BLADE) IMPLANT
BLADE SURG 10 STRL SS (BLADE) IMPLANT
CABLE ADAPT CONN TEMP 6FT (ADAPTER) ×3 IMPLANT
CABLE SURGICAL S-101-97-12 (CABLE) ×3 IMPLANT
CATH DIAG EXPO 6F AL1 (CATHETERS) IMPLANT
CATH DIAG EXPO 6F VENT PIG 145 (CATHETERS) ×6 IMPLANT
CATH EXTERNAL FEMALE PUREWICK (CATHETERS) IMPLANT
CATH INFINITI 6F AL2 (CATHETERS) ×3 IMPLANT
CATH S G BIP PACING (CATHETERS) ×3 IMPLANT
CHLORAPREP W/TINT 26 (MISCELLANEOUS) ×3 IMPLANT
CLIP VESOCCLUDE MED 24/CT (CLIP) IMPLANT
CLIP VESOCCLUDE SM WIDE 24/CT (CLIP) IMPLANT
CLOSURE MYNX CONTROL 6F/7F (Vascular Products) ×3 IMPLANT
CNTNR URN SCR LID CUP LEK RST (MISCELLANEOUS) ×4 IMPLANT
CONT SPEC 4OZ STRL OR WHT (MISCELLANEOUS) ×2
COVER BACK TABLE 80X110 HD (DRAPES) ×3 IMPLANT
DECANTER SPIKE VIAL GLASS SM (MISCELLANEOUS) ×3 IMPLANT
DERMABOND ADVANCED (GAUZE/BANDAGES/DRESSINGS) ×1
DERMABOND ADVANCED .7 DNX12 (GAUZE/BANDAGES/DRESSINGS) ×2 IMPLANT
DEVICE CLOSURE PERCLS PRGLD 6F (VASCULAR PRODUCTS) ×4 IMPLANT
DRAPE INCISE IOBAN 66X45 STRL (DRAPES) IMPLANT
DRSG TEGADERM 4X4.75 (GAUZE/BANDAGES/DRESSINGS) ×3 IMPLANT
ELECT CAUTERY BLADE 6.4 (BLADE) IMPLANT
ELECT REM PT RETURN 9FT ADLT (ELECTROSURGICAL) ×6
ELECTRODE REM PT RTRN 9FT ADLT (ELECTROSURGICAL) ×4 IMPLANT
FELT TEFLON 6X6 (MISCELLANEOUS) IMPLANT
GAUZE SPONGE 4X4 12PLY STRL (GAUZE/BANDAGES/DRESSINGS) ×3 IMPLANT
GLOVE EUDERMIC 7 POWDERFREE (GLOVE) IMPLANT
GLOVE SURG ENC MOIS LTX SZ7.5 (GLOVE) ×3 IMPLANT
GLOVE SURG ENC MOIS LTX SZ8 (GLOVE) IMPLANT
GLOVE SURG ORTHO LTX SZ7.5 (GLOVE) IMPLANT
GOWN STRL REUS W/ TWL LRG LVL3 (GOWN DISPOSABLE) IMPLANT
GOWN STRL REUS W/ TWL XL LVL3 (GOWN DISPOSABLE) ×2 IMPLANT
GOWN STRL REUS W/TWL LRG LVL3 (GOWN DISPOSABLE)
GOWN STRL REUS W/TWL XL LVL3 (GOWN DISPOSABLE) ×1
GUIDEWIRE CNFDA BRKR CVD (WIRE) ×3 IMPLANT
GUIDEWIRE SAFE TJ AMPLATZ EXST (WIRE) ×3 IMPLANT
INSERT FOGARTY SM (MISCELLANEOUS) IMPLANT
KIT BASIN OR (CUSTOM PROCEDURE TRAY) ×3 IMPLANT
KIT HEART LEFT (KITS) ×3 IMPLANT
KIT SUCTION CATH 14FR (SUCTIONS) IMPLANT
KIT TURNOVER KIT B (KITS) ×3 IMPLANT
LOOP VESSEL MAXI BLUE (MISCELLANEOUS) IMPLANT
LOOP VESSEL MINI RED (MISCELLANEOUS) IMPLANT
NS IRRIG 1000ML POUR BTL (IV SOLUTION) ×3 IMPLANT
PACK ENDO MINOR (CUSTOM PROCEDURE TRAY) ×3 IMPLANT
PAD ARMBOARD 7.5X6 YLW CONV (MISCELLANEOUS) ×6 IMPLANT
PAD ELECT DEFIB RADIOL ZOLL (MISCELLANEOUS) ×3 IMPLANT
PENCIL BUTTON HOLSTER BLD 10FT (ELECTRODE) IMPLANT
PERCLOSE PROGLIDE 6F (VASCULAR PRODUCTS) ×6
POSITIONER HEAD DONUT 9IN (MISCELLANEOUS) ×3 IMPLANT
SET MICROPUNCTURE 5F STIFF (MISCELLANEOUS) ×3 IMPLANT
SHEATH BRITE TIP 7FR 35CM (SHEATH) ×3 IMPLANT
SHEATH PINNACLE 6F 10CM (SHEATH) ×3 IMPLANT
SHEATH PINNACLE 8F 10CM (SHEATH) ×3 IMPLANT
SLEEVE REPOSITIONING LENGTH 30 (MISCELLANEOUS) ×3 IMPLANT
STOPCOCK MORSE 400PSI 3WAY (MISCELLANEOUS) ×6 IMPLANT
SUT ETHIBOND 5 LR DA (SUTURE) ×3 IMPLANT
SUT ETHIBOND X763 2 0 SH 1 (SUTURE) IMPLANT
SUT GORETEX CV 4 TH 22 36 (SUTURE) IMPLANT
SUT GORETEX CV4 TH-18 (SUTURE) IMPLANT
SUT MNCRL AB 3-0 PS2 18 (SUTURE) IMPLANT
SUT PROLENE 5 0 C 1 36 (SUTURE) IMPLANT
SUT PROLENE 6 0 C 1 30 (SUTURE) IMPLANT
SUT SILK  1 MH (SUTURE) ×1
SUT SILK 1 MH (SUTURE) ×2 IMPLANT
SUT VIC AB 2-0 CT1 27 (SUTURE)
SUT VIC AB 2-0 CT1 TAPERPNT 27 (SUTURE) IMPLANT
SUT VIC AB 2-0 CTX 36 (SUTURE) IMPLANT
SUT VIC AB 3-0 SH 8-18 (SUTURE) IMPLANT
SYR 50ML LL SCALE MARK (SYRINGE) ×3 IMPLANT
SYR BULB IRRIG 60ML STRL (SYRINGE) IMPLANT
SYR MEDRAD MARK V 150ML (SYRINGE) ×3 IMPLANT
TOWEL GREEN STERILE (TOWEL DISPOSABLE) ×6 IMPLANT
TRANSDUCER W/STOPCOCK (MISCELLANEOUS) ×6 IMPLANT
TRAY FOLEY SLVR 16FR TEMP STAT (SET/KITS/TRAYS/PACK) IMPLANT
TUBING CIL FLEX 10 FLL-RA (TUBING) ×3 IMPLANT
VALVE 26 ULTRA SAPIEN KIT (Valve) ×3 IMPLANT
WIRE EMERALD 3MM-J .035X150CM (WIRE) ×3 IMPLANT
WIRE EMERALD 3MM-J .035X260CM (WIRE) ×3 IMPLANT

## 2021-07-16 NOTE — Anesthesia Procedure Notes (Signed)
Procedure Name: MAC Date/Time: 07/16/2021 11:40 AM Performed by: Mariea Clonts, CRNA Pre-anesthesia Checklist: Patient identified, Emergency Drugs available, Suction available, Patient being monitored and Timeout performed Patient Re-evaluated:Patient Re-evaluated prior to induction Oxygen Delivery Method: Simple face mask

## 2021-07-16 NOTE — Transfer of Care (Signed)
Immediate Anesthesia Transfer of Care Note  Patient: Oscar Bailey  Procedure(s) Performed: TRANSCATHETER AORTIC VALVE REPLACEMENT, TRANSFEMORAL USING A 26 EDWARDS AORTIC VALVE (Chest) TRANSESOPHAGEAL ECHOCARDIOGRAM (TEE)  Patient Location: PACU  Anesthesia Type:MAC  Level of Consciousness: awake, alert  and oriented  Airway & Oxygen Therapy: Patient Spontanous Breathing and Patient connected to nasal cannula oxygen  Post-op Assessment: Report given to RN and Post -op Vital signs reviewed and stable  Post vital signs: Reviewed and stable  Last Vitals:  Vitals Value Taken Time  BP 113/66 07/16/21 1352  Temp 36.6 C 07/16/21 1343  Pulse 60 07/16/21 1353  Resp 15 07/16/21 1353  SpO2 96 % 07/16/21 1353  Vitals shown include unvalidated device data.  Last Pain:  Vitals:   07/16/21 1343  TempSrc: Temporal  PainSc: Asleep         Complications: No notable events documented.

## 2021-07-16 NOTE — CV Procedure (Signed)
[facility]  TAVR OPERATIVE NOTE   Date of Procedure:  07/16/2021  Preoperative Diagnosis: Severe Aortic Stenosis   Postoperative Diagnosis: Same   Procedure:   Transcatheter Aortic Valve Replacement - Transfemoral Approach  Edwards Sapien 3 THV (size 26 mm, model # 9600TFX, serial # [id number])   Co-Surgeons:  Darlina Guys, MD, Gaye Pollack, MD, and Lenna Sciara, MD     Echocardiographer:  Jenkins Rouge, MD  Pre-operative Echo Findings: Severe aortic stenosis  Post-operative Echo Findings: Normally functioning TAVR bioprosthesis  BRIEF CLINICAL NOTE AND INDICATIONS FOR SURGERY  The patient is a 70 yo male with history of arthritis, CAD, HTN, hyperlipidemia, hypothroidism, prostate cancer and severe symptomatic aortic stenosis referred by Dr. Acie Fredrickson. During the course of the patient's preoperative work up they have been evaluated comprehensively by a multidisciplinary team of specialists coordinated through the [facility] in the [facility].  They have been demonstrated to suffer from symptomatic severe aortic stenosis as noted above. The patient has been counseled extensively as to the relative risks and benefits of all options for the treatment of severe aortic stenosis including long term medical therapy, conventional surgery for aortic valve replacement, and transcatheter aortic valve replacement.  The patient has been independently evaluated by Dr. Cyndia Bent with CT surgery and they are felt to be at high risk for conventional surgical aortic valve replacement. The surgeon indicated the patient would be a poor candidate for conventional surgery. Based upon review of all of the patient's preoperative diagnostic tests they are felt to be candidate for transcatheter aortic valve replacement using the transfemoral approach as an alternative to high risk conventional surgery.    Following the decision to proceed with  transcatheter aortic valve replacement, a discussion has been held regarding what types of management strategies would be attempted intraoperatively in the event of life-threatening complications, including whether or not the patient would be considered a candidate for the use of cardiopulmonary bypass and/or conversion to open sternotomy for attempted surgical intervention.  The patient has been advised of a variety of complications that might develop peculiar to this approach including but not limited to risks of death, stroke, paravalvular leak, aortic dissection or other major vascular complications, aortic annulus rupture, device embolization, cardiac rupture or perforation, acute myocardial infarction, arrhythmia, heart block or bradycardia requiring permanent pacemaker placement, congestive heart failure, respiratory failure, renal failure, pneumonia, infection, other late complications related to structural valve deterioration or migration, or other complications that might ultimately cause a temporary or permanent loss of functional independence or other long term morbidity.  The patient provides full informed consent for the procedure as described and all questions were answered preoperatively.    DETAILS OF THE OPERATIVE PROCEDURE  PREPARATION:   The patient is brought to the operating room on the above mentioned date and central monitoring was established by the anesthesia team including placement of a radial arterial line. The patient is placed in the supine position on the operating table.  Intravenous antibiotics are administered. Conscious sedation is used.   Baseline transthoracic echocardiogram was performed. The patient's chest, abdomen, both groins, and both lower extremities are prepared and draped in a sterile manner. A time out procedure is performed.   PERIPHERAL ACCESS:   Using the modified Seldinger technique, femoral arterial and venous access were obtained with placement of a  6 Fr sheath in the artery and a 7 Fr sheath in the vein on the left side using  u/s guidance.  A pigtail diagnostic catheter was passed through the femoral arterial sheath under fluoroscopic guidance into the aortic root. Aortic root angiography was performed in order to determine the optimal angiographic angle for valve deployment.  TRANSFEMORAL ACCESS:  A micropuncture kit was used to gain access to the right femoral artery using u/s guidance. Position confirmed with angiography. Pre-closure with double ProGlide closure devices. The patient was heparinized systemically and ACT verified > 250 seconds.    A 14 Fr transfemoral E-sheath was introduced into the right femoral artery after progressively dilating over an Amplatz superstiff wire. An AL-1 catheter was used to direct a straight-tip exchange length wire across the native aortic valve into the left ventricle. This was exchanged out for a pigtail catheter and position was confirmed in the LV apex. A Confida wire was placed in the LV apex, direct pacing thresholds tested, and found to be adequate.    TRANSCATHETER HEART VALVE DEPLOYMENT:  An Edwards Sapien 3 THV (size 65m) was prepared and crimped per manufacturer's guidelines, and the proper orientation of the valve is confirmed on the [employer] Corporationdelivery system. The valve was advanced through the introducer sheath using normal technique until in an appropriate position in the abdominal aorta beyond the sheath tip. The balloon was then retracted and using the fine-tuning wheel was centered on the valve. The valve was then advanced across the aortic arch using appropriate flexion of the catheter. The valve was carefully positioned across the aortic valve annulus. The Commander catheter was retracted using normal technique. Once final position of the valve has been confirmed by angiographic assessment, the valve is deployed while temporarily holding ventilation and during rapid ventricular pacing  to maintain systolic blood pressure < 50 mmHg and pulse pressure < 10 mmHg. The balloon inflation is held for >3 seconds after reaching full deployment volume. Once the balloon has fully deflated the balloon is retracted into the ascending aorta and valve function is assessed using TTE. There is felt to be no paravalvular leak and no central aortic insufficiency.  The patient's hemodynamic recovery following valve deployment is good.  The deployment balloon and guidewire are both removed. Echo demostrated acceptable post-procedural gradients, stable mitral valve function, and no AI.   PROCEDURE COMPLETION:  The sheath was then removed and closure devices were completed. Protamine was administered once femoral arterial repair was complete. The temporary pacemaker, pigtail catheters and femoral sheaths were removed with a Mynx closure device placed in the artery and manual pressure used for venous hemostasis.    The patient tolerated the procedure well and is transported to the surgical intensive care in stable condition. There were no immediate intraoperative complications. All sponge instrument and needle counts are verified correct at completion of the operation.   No blood products were administered during the operation.    AEarly OsmondMD 07/16/2021 1:19 PM

## 2021-07-16 NOTE — Interval H&P Note (Signed)
History and Physical Interval Note:  07/16/2021 9:36 AM  Oscar Bailey  has presented today for surgery, with the diagnosis of AO.  The various methods of treatment have been discussed with the patient and family. After consideration of risks, benefits and other options for treatment, the patient has consented to  Procedure(s): TRANSCATHETER AORTIC VALVE REPLACEMENT, TRANSFEMORAL (N/A) TRANSESOPHAGEAL ECHOCARDIOGRAM (TEE) (N/A) as a surgical intervention.  The patient's history has been reviewed, patient examined, no change in status, stable for surgery.  I have reviewed the patient's chart and labs.  Questions were answered to the patient's satisfaction.     Gaye Pollack

## 2021-07-16 NOTE — Progress Notes (Signed)
  Echocardiogram 2D Echocardiogram has been performed.  Darlina Sicilian M 07/16/2021, 1:16 PM

## 2021-07-16 NOTE — Op Note (Signed)
[facility]   MULTIDISCIPLINARY HEART VALVE TEAM   TAVR OPERATIVE NOTE   Date of Procedure:  07/16/2021  Preoperative Diagnosis: Severe Aortic Stenosis   Postoperative Diagnosis: Same   Procedure:   Transcatheter Aortic Valve Replacement - Percutaneous right Transfemoral Approach  Edwards Sapien 3 Ultra THV (size 26 mm, model # 9750TFX, serial # [id number])   Co-Surgeons:  Gaye Pollack, MD and Lauree Chandler, MD, and Lenna Sciara, MD   Anesthesiologist:  Suzette Battiest, MD  Echocardiographer:  Jenkins Rouge, MD  Pre-operative Echo Findings: Severe aortic stenosis Normal left ventricular systolic function  Post-operative Echo Findings: No paravalvular leak Normal left ventricular systolic function   BRIEF CLINICAL NOTE AND INDICATIONS FOR SURGERY  This 70 year old gentleman has stage D3, severe, symptomatic aortic stenosis with New York Heart Association class II symptoms of exertional fatigue and shortness of breath consistent with chronic diastolic congestive heart failure.  I have personally reviewed his 2D echocardiogram, cardiac catheterization, and CTA studies.  His echocardiogram showed a heavily calcified aortic valve with leaflet thickening and restricted mobility with a mean gradient of 35 mmHg and a peak gradient of 63 mmHg.  Peak velocity was 3.96 m/s.  Dimensionless index was 0.25 with a valve area of 0.91 cm consistent with severe aortic stenosis.  Left ventricular function was preserved.  Cardiac catheterization showed a patent diagonal stent and otherwise mild nonobstructive disease in the LAD and RCA.  The mean gradient was measured at 23 mmHg.  I agree that aortic valve replacement is indicated in this patient for relief of his symptoms and to prevent progressive left ventricular deterioration.  He is only 70 years old but he is a ongoing heavy smoker with significant arthritis and I think transcatheter aortic valve replacement is a  reasonable alternative for him.  His gated cardiac CTA shows a heavily calcified aortic valve with restricted opening.  Anatomy appears suitable for a 26 mm SAPIEN 3 valve.  His abdominal and pelvic CTA shows adequate pelvic vascular anatomy to allow transfemoral insertion.   The patient was counseled at length regarding treatment alternatives for management of severe symptomatic aortic stenosis. The risks and benefits of surgical intervention has been discussed in detail. Long-term prognosis with medical therapy was discussed. Alternative approaches such as conventional surgical aortic valve replacement, transcatheter aortic valve replacement, and palliative medical therapy were compared and contrasted at length. This discussion was placed in the context of the patient's own specific clinical presentation and past medical history. All of his questions have been addressed.    Following the decision to proceed with transcatheter aortic valve replacement, a discussion was held regarding what types of management strategies would be attempted intraoperatively in the event of life-threatening complications, including whether or not the patient would be considered a candidate for the use of cardiopulmonary bypass and/or conversion to open sternotomy for attempted surgical intervention.  I think he would be a candidate for emergent sternotomy to manage any intraoperative complications the patient is aware of the fact that transient use of cardiopulmonary bypass may be necessary. The patient has been advised of a variety of complications that might develop including but not limited to risks of death, stroke, paravalvular leak, aortic dissection or other major vascular complications, aortic annulus rupture, device embolization, cardiac rupture or perforation, mitral regurgitation, acute myocardial infarction, arrhythmia, heart block or bradycardia requiring permanent pacemaker placement, congestive heart failure,  respiratory failure, renal failure, pneumonia, infection, other late complications related to structural valve deterioration or migration,  or other complications that might ultimately cause a temporary or permanent loss of functional independence or other long term morbidity. The patient provides full informed consent for the procedure as described and all questions were answered.     DETAILS OF THE OPERATIVE PROCEDURE  PREPARATION:    The patient was brought to the operating room on the above mentioned date and appropriate monitoring was established by the anesthesia team. The patient was placed in the supine position on the operating table.  Intravenous antibiotics were administered. The patient was monitored closely throughout the procedure under conscious sedation.   Baseline transthoracic echocardiogram was performed. The patient's abdomen and both groins were prepped and draped in a sterile manner. A time out procedure was performed.   PERIPHERAL ACCESS:    Using the modified Seldinger technique, femoral arterial and venous access was obtained with placement of 6 Fr sheaths on the left side.  A pigtail diagnostic catheter was passed through the left arterial sheath under fluoroscopic guidance into the aortic root.   Aortic root angiography was performed in order to determine the optimal angiographic angle for valve deployment.   TRANSFEMORAL ACCESS:   Percutaneous transfemoral access and sheath placement was performed using ultrasound guidance.  The right common femoral artery was cannulated using a micropuncture needle and appropriate location was verified using hand injection angiogram.  A pair of Abbott Perclose percutaneous closure devices were placed and a 6 French sheath replaced into the femoral artery.  The patient was heparinized systemically and ACT verified > 250 seconds.    A 14 Fr transfemoral E-sheath was introduced into the right common femoral artery after progressively  dilating over an Amplatz superstiff wire. An AL-1 catheter was used to direct a straight-tip exchange length wire across the native aortic valve into the left ventricle. This was exchanged out for a pigtail catheter and position was confirmed in the LV apex. Simultaneous LV and Ao pressures were recorded.  The pigtail catheter was exchanged for a Confida wire in the LV apex.     BALLOON AORTIC VALVULOPLASTY:   Not performed   TRANSCATHETER HEART VALVE DEPLOYMENT:   An Edwards Sapien 3 Ultra transcatheter heart valve (size 26 mm) was prepared and crimped per manufacturer's guidelines, and the proper orientation of the valve is confirmed on the [employer] delivery system. The valve was advanced through the introducer sheath using normal technique until in an appropriate position in the abdominal aorta beyond the sheath tip. The balloon was then retracted and using the fine-tuning wheel was centered on the valve. The valve was then advanced across the aortic arch using appropriate flexion of the catheter. The valve was carefully positioned across the aortic valve annulus. The Commander catheter was retracted using normal technique. Once final position of the valve has been confirmed by angiographic assessment, the valve is deployed while temporarily holding ventilation and during rapid ventricular pacing via the Confida wire to maintain systolic blood pressure < 50 mmHg and pulse pressure < 10 mmHg. The balloon inflation is held for >3 seconds after reaching full deployment volume. Once the balloon has fully deflated the balloon is retracted into the ascending aorta and valve function is assessed using echocardiography. There is felt to be no paravalvular leak and no central aortic insufficiency.  The patient's hemodynamic recovery following valve deployment is good.  The deployment balloon and guidewire are both removed.    PROCEDURE COMPLETION:   The sheath was removed and femoral artery closure  performed.  Protamine was  administered once femoral arterial repair was complete. The pigtail catheters and femoral sheaths were removed with manual pressure used for hemostasis.  A Mynx femoral closure device was utilized following removal of the diagnostic sheath in the left femoral artery.  The patient tolerated the procedure well and is transported to the cath lab recovery area in stable condition. There were no immediate intraoperative complications. All sponge instrument and needle counts are verified correct at completion of the operation.   No blood products were administered during the operation.  The patient received a total of 110 mL of intravenous contrast during the procedure.   Gaye Pollack, MD 07/16/2021

## 2021-07-16 NOTE — Progress Notes (Addendum)
  [place] VALVE TEAM  Patient doing well s/p TAVR. He is hemodynamically stable. Still a bit sedated. Groin sites stable. ECG with sinus and new IVCD but no high grade block. Arterial line discontinued and transferred to 4E. Plan for early ambulation after bedrest completed and hopeful discharge over the next 24-48 hours.   Angelena Form PA-C  [facility]  Pager [phone]

## 2021-07-16 NOTE — Anesthesia Procedure Notes (Addendum)
Arterial Line Insertion Performed by: Valda Favia, CRNA, CRNA  Patient location: Pre-op. Preanesthetic checklist: patient identified, IV checked, site marked, risks and benefits discussed, surgical consent, monitors and equipment checked, pre-op evaluation, timeout performed and anesthesia consent Lidocaine 1% used for infiltration Right, radial was placed Catheter size: 20 G Hand hygiene performed  and maximum sterile barriers used   Attempts: 1 Procedure performed without using ultrasound guided technique. Following insertion, dressing applied and Biopatch. Post procedure assessment: normal and unchanged  Patient tolerated the procedure well with no immediate complications.

## 2021-07-17 ENCOUNTER — Encounter (HOSPITAL_COMMUNITY): Payer: Self-pay | Admitting: Cardiovascular Disease

## 2021-07-17 ENCOUNTER — Inpatient Hospital Stay (HOSPITAL_COMMUNITY): Payer: Medicare Other

## 2021-07-17 DIAGNOSIS — I350 Nonrheumatic aortic (valve) stenosis: Secondary | ICD-10-CM

## 2021-07-17 LAB — BASIC METABOLIC PANEL
Anion gap: 8 (ref 5–15)
BUN: 14 mg/dL (ref 8–23)
CO2: 23 mmol/L (ref 22–32)
Calcium: 8.6 mg/dL — ABNORMAL LOW (ref 8.9–10.3)
Chloride: 104 mmol/L (ref 98–111)
Creatinine, Ser: 1.03 mg/dL (ref 0.61–1.24)
GFR, Estimated: >60 mL/min (ref >60)
Glucose, Bld: 91 mg/dL (ref 70–99)
Potassium: 3.7 mmol/L (ref 3.5–5.1)
Sodium: 135 mmol/L (ref 135–145)

## 2021-07-17 LAB — CBC
HCT: 39.6 % (ref 39.0–52.0)
Hemoglobin: 13.1 g/dL (ref 13.0–17.0)
MCH: 31.7 pg (ref 26.0–34.0)
MCHC: 33.1 g/dL (ref 30.0–36.0)
MCV: 95.9 fL (ref 80.0–100.0)
Platelets: 142 10*3/uL — ABNORMAL LOW (ref 150–400)
RBC: 4.13 MIL/uL — ABNORMAL LOW (ref 4.22–5.81)
RDW: 13.2 % (ref 11.5–15.5)
WBC: 7 10*3/uL (ref 4.0–10.5)
nRBC: 0 % (ref 0.0–0.2)

## 2021-07-17 LAB — ECHOCARDIOGRAM COMPLETE
AR max vel: 2.83 cm2
AV Area VTI: 3.39 cm2
AV Area mean vel: 3.38 cm2
AV Mean grad: 16 mmHg
AV Peak grad: 35.3 mmHg
Ao pk vel: 2.97 m/s
Area-P 1/2: 3.27 cm2
Calc EF: 74.5 %
Height: 67 in
S' Lateral: 2.9 cm
Single Plane A2C EF: 70.8 %
Single Plane A4C EF: 78.7 %
Weight: 3319.25 oz

## 2021-07-17 NOTE — Progress Notes (Signed)
  Echocardiogram 2D Echocardiogram has been performed.  Oscar Bailey 07/17/2021, 10:40 AM

## 2021-07-17 NOTE — Progress Notes (Signed)
CARDIAC REHAB PHASE I   PRE:  Rate/Rhythm: 80 SR  BP:  Supine: 146/76  Sitting:   Standing: 94 RA   SaO2: 94 RA  MODE:  Ambulation: 790 ft   POST:  Rate/Rhythm: 100 SR  BP:  Supine:   Sitting: 155/107 163/91 160/76  Standing:    SaO2: 93 RA 1020-1130 Assisted X1 to start ambulate. Gait steady. Pt able to walk 790 feet without c//o. BP after walk 155/107 rechecked 163/91, 160/76. Pt 's BP came down as he rested after walk. He states that he didn't have his BP medication yesterday or today. Pt to recliner after walk with call light in reach. Completd TAVR discharge education with pt. Discussed exercise guidelines, heart healthy diet, smoking cessation and Outpt. CRP. He states that he would like to quit smoking. I gave him tips for quitting and the 1-800 Quit line information from [facility]. He states that he quit smoking and drinking for 8 years but restarted after dating someone that smoked. He seems motivated to stop smoking and drinking. I stronger encouraged both. He voices understanding of information provided. I will send referral to Outpt. [facility] in [place].  Rodney Langton RN 07/17/2021 12:07 PM

## 2021-07-17 NOTE — Discharge Summary (Addendum)
[place] VALVE TEAM  Discharge Summary    Patient ID: Oscar Bailey MRN: [id number]; DOB: [birth year]  Admit date: 07/16/2021 Discharge date: 07/17/2021  Primary Care Provider: Lois Huxley, PA  Primary Cardiologist: Mertie Moores, MD /Dr. Lenna Sciara, MD & Dr. Gilford Raid, MD (TAVR)  Discharge Diagnoses    Principal Problem:   S/P TAVR (transcatheter aortic valve replacement) Active Problems:   Malignant neoplasm of prostate [facility])   Aortic stenosis   HTN (hypertension)   HLD (hyperlipidemia)   CAD S/P percutaneous coronary angioplasty  Allergies No Known Allergies  Diagnostic Studies/Procedures      TAVR OPERATIVE NOTE     Date of Procedure:                07/16/2021   Preoperative Diagnosis:      Severe Aortic Stenosis    Postoperative Diagnosis:    Same    Procedure:        Transcatheter Aortic Valve Replacement - Percutaneous right Transfemoral Approach             Edwards Sapien 3 Ultra THV (size 26 mm, model # 9750TFX, serial # [id number])              Co-Surgeons:                        Gaye Pollack, MD and Lauree Chandler, MD, and Lenna Sciara, MD     Anesthesiologist:                  Suzette Battiest, MD   Echocardiographer:              Jenkins Rouge, MD   Pre-operative Echo Findings: Severe aortic stenosis Normal left ventricular systolic function   Post-operative Echo Findings: No paravalvular leak Normal left ventricular systolic function   ____________   Echo 07/17/21: Completed however pending formal read at the time of discharge   History of Present Illness     Oscar Bailey is a 70 y.o. male with a history of arthritis, CAD w/PCI to diagonal in 2011, HTN, hyperlipidemia, hypothyroidism, prostate cancer and severe aortic stenosis who presented to [facility] on 07/16/21 for planned TAVR.   He is followed by Dr. Acie Fredrickson for his cardiology care and was seen back follow-up in 02/2021 and was  reporting exertional fatigue and chest discomfort. A follow-up echo on 02/28/2021 showed an ejection fraction of 65 to 70%.  The mean gradient across aortic valve had increased to 35 mmHg with a peak gradient of 63 mmHg. Aortic valve area was 0.87 cm with a dimensionless index of 0.27. Stroke-volume index was 33. He underwent cardiac catheterization on 04/01/2021 showing a patent diagonal stent and otherwise mild nonobstructive coronary disease. The mean gradient across aortic valve was measured at 23 mmHg with a peak gradient of 25 mmHg.  A gated cardiac CTA was performed showing a severely calcified aortic valve with restricted leaflet mobility.  There was severe bulky calcification in the right coronary cusp.  The patient has been evaluated by the multidisciplinary valve team and felt to have severe, symptomatic aortic stenosis and to be a suitable candidate for TAVR, which was set up for 07/16/21.   Hospital Course     Severe AS: s/p successful TAVR with a 23mm Edwards Sapien 3 Ultra via the TF approach on 07/16/21. Post operative echo pending. Groin sites are stable without signs of hematoma or  bleeding. ECG with NSR and no high grade heart block. Continue on ASA 81mg  QD. BP slightly elevated therefore we will resume home lisinopril 5mg  and metoprolol 12.5mg  BID. Post procedure labs with stable creatinine and Hb. He has been scheduled for follow up in one week with our APP and one month with echocardiogram prior to appointment. Post TAVR instructions have been given. Cardiac rehabilitation to see prior to discharge.   Will discuss the need for SBE during our office visit next week.   Consultants: None    The patient was seen and examined by Dr. Ali Lowe who feels that the patient is stable and ready for discharge today, 07/17/21.  _____________  Discharge Vitals Blood pressure 140/71, pulse 76, temperature 98 F (36.7 C), temperature source Oral, resp. rate 18, height 5\' 7"  (1.702 m), weight 94.1  kg, SpO2 93 %.  Filed Weights   07/16/21 0903 07/17/21 0405  Weight: 93 kg 94.1 kg   General: Well developed, well nourished, NAD Neck: Negative for carotid bruits. No JVD Lungs:Clear to ausculation bilaterally. Breathing is unlabored. Cardiovascular: RRR with S1 S2. + soft murmurs Abdomen: Soft, non-tender, non-distended. No obvious abdominal masses. Extremities: No edema.  Neuro: Alert and oriented. No focal deficits. No facial asymmetry. MAE spontaneously. Psych: Responds to questions appropriately with normal affect.    Labs & Radiologic Studies    CBC Recent Labs    07/16/21 1356 07/17/21 0121  WBC  --  7.0  HGB 12.2* 13.1  HCT 36.0* 39.6  MCV  --  95.9  PLT  --  027*   Basic Metabolic Panel Recent Labs    07/16/21 1356 07/17/21 0121  NA 141 135  K 4.3 3.7  CL 107 104  CO2  --  23  GLUCOSE 102* 91  BUN 12 14  CREATININE 0.90 1.03  CALCIUM  --  8.6*   Liver Function Tests No results for input(s): AST, ALT, ALKPHOS, BILITOT, PROT, ALBUMIN in the last 72 hours. No results for input(s): LIPASE, AMYLASE in the last 72 hours. Cardiac Enzymes No results for input(s): CKTOTAL, CKMB, CKMBINDEX, TROPONINI in the last 72 hours. BNP Invalid input(s): POCBNP D-Dimer No results for input(s): DDIMER in the last 72 hours. Hemoglobin A1C No results for input(s): HGBA1C in the last 72 hours. Fasting Lipid Panel No results for input(s): CHOL, HDL, LDLCALC, TRIG, CHOLHDL, LDLDIRECT in the last 72 hours. Thyroid Function Tests No results for input(s): TSH, T4TOTAL, T3FREE, THYROIDAB in the last 72 hours.  Invalid input(s): FREET3 _____________  DG Chest 2 View  Result Date: 07/13/2021 CLINICAL DATA:  Preoperative.  TAVR EXAM: CHEST - 2 VIEW COMPARISON:  June 23, 2017 FINDINGS: The cardiomediastinal silhouette is unchanged in contour. No pleural effusion. No pneumothorax. No acute pleuroparenchymal abnormality. Visualized abdomen is unremarkable. Mild multilevel  degenerative changes of the thoracic spine. IMPRESSION: No acute cardiopulmonary abnormality. Electronically Signed   By: Valentino Saxon M.D.   On: 07/13/2021 13:30   ECHOCARDIOGRAM LIMITED  Result Date: 07/16/2021    ECHOCARDIOGRAM LIMITED REPORT   Patient Name:   Oscar Bailey Date of Exam: 07/16/2021 Medical Rec #:  [id number]      Height:       67.0 in Accession #:    [id number]     Weight:       205.0 lb Date of Birth:  1950/12/20      BSA:          2.044 m Patient Age:    67 years  BP:           160/65 mmHg Patient Gender: M              HR:           63 bpm. Exam Location:  Inpatient Procedure: Limited Echo, Limited Color Doppler and Cardiac Doppler Indications:    Aortic stenosis I35.0  History:        Patient has prior history of Echocardiogram examinations, most                 recent 02/28/2021. Previous Myocardial Infarction and CAD, Aortic                 Valve Disease, Signs/Symptoms:Murmur; Risk Factors:Hypertension                 and Dyslipidemia. Thyroid disease.                 Aortic Valve: 26 mm Sapien prosthetic, stented (TAVR) valve is                 present in the aortic position. Procedure Date: 07/16/2021.  Sonographer:    Darlina Sicilian RDCS Referring Phys: [id number] [place]  1. Left ventricular ejection fraction, by estimation, is 55 to 60%. The left ventricle has normal function. There is mild left ventricular hypertrophy.  2. The mitral valve is normal in structure. No evidence of mitral valve regurgitation.  3. Pre TAVR: severe AS with calcified tri leaflet AV. mean gradient 21 mmHg peak 37 mmHg supine in OR with mild AR and AVA 1.1 cm2         Post TAVR: well positioned 26 mm Sapien 3 Ultra valve No PVL mean gradient 2 peak 5 mmHg AVA 3.6 cm2. Aortic valve regurgitation is mild. Severe aortic valve stenosis. There is a 26 mm Sapien prosthetic (TAVR) valve present in the aortic position. Procedure Date: 07/16/2021. FINDINGS  Left Ventricle: Left  ventricular ejection fraction, by estimation, is 55 to 60%. The left ventricle has normal function. The left ventricular internal cavity size was normal in size. There is mild left ventricular hypertrophy. Pericardium: There is no evidence of pericardial effusion. Mitral Valve: The mitral valve is normal in structure. There is mild thickening of the mitral valve leaflet(s). There is mild calcification of the mitral valve leaflet(s). Aortic Valve: Pre TAVR: severe AS with calcified tri leaflet AV. mean gradient 21 mmHg peak 37 mmHg supine in OR with mild AR and AVA 1.1 cm2 Post TAVR: well positioned 26 mm Sapien 3 Ultra valve No PVL mean gradient 2 peak 5 mmHg AVA 3.6 cm2. Aortic valve regurgitation is mild. Severe aortic stenosis is present. Aortic valve mean gradient measures 2.0 mmHg. Aortic valve peak gradient measures  4.8 mmHg. Aortic valve area, by VTI measures 3.64 cm. There is a 26 mm Sapien prosthetic, stented (TAVR) valve present in the aortic position. Procedure Date: 07/16/2021. LEFT VENTRICLE PLAX 2D LVOT diam:     2.30 cm LV SV:         79 LV SV Index:   38 LVOT Area:     4.15 cm  AORTIC VALVE AV Area (Vmax):    3.03 cm AV Area (Vmean):   3.07 cm AV Area (VTI):     3.64 cm AV Vmax:           109.00 cm/s AV Vmean:          71.200 cm/s AV VTI:  0.216 m AV Peak Grad:      4.8 mmHg AV Mean Grad:      2.0 mmHg LVOT Vmax:         79.60 cm/s LVOT Vmean:        52.600 cm/s LVOT VTI:          0.189 m LVOT/AV VTI ratio: 0.88  SHUNTS Systemic VTI:  0.19 m Systemic Diam: 2.30 cm Jenkins Rouge MD Electronically signed by Jenkins Rouge MD Signature Date/Time: 07/16/2021/1:36:35 PM    Final    Structural Heart Procedure  Result Date: 07/16/2021 See surgical note for result.   Disposition   Pt is being discharged home today in good condition.  Follow-up Plans & Appointments    Follow-up Information     Eileen Stanford, PA-C. Go on 07/24/2021.   Specialties: Cardiology, Radiology Why: at  3:30pm. Please arrive at 3:15pm Contact information: [address] [id number] [phone]                Discharge Medications   Allergies as of 07/17/2021   No Known Allergies      Medication List     TAKE these medications    aspirin EC 81 MG tablet Take 81 mg by mouth daily.   Blink Tears 0.25 % Soln Generic drug: Polyethylene Glycol 400 Place 1 drop into both eyes 2 (two) times daily as needed (dry eyes).   ezetimibe 10 MG tablet Commonly known as: ZETIA Take 10 mg by mouth daily.   fluticasone 50 MCG/ACT nasal spray Commonly known as: FLONASE Place 2 sprays into both nostrils daily as needed for allergies or rhinitis.   levothyroxine 125 MCG tablet Commonly known as: SYNTHROID Take 125 mcg by mouth daily before breakfast.   lisinopril 5 MG tablet Commonly known as: ZESTRIL Take 5 mg by mouth daily.   metoprolol tartrate 25 MG tablet Commonly known as: LOPRESSOR Take 12.5 mg by mouth 2 (two) times daily.   nitroGLYCERIN 0.4 MG SL tablet Commonly known as: NITROSTAT Place 1 tablet (0.4 mg total) under the tongue every 5 (five) minutes as needed for chest pain.   rosuvastatin 40 MG tablet Commonly known as: CRESTOR Take 40 mg by mouth daily.   tamsulosin 0.4 MG Caps capsule Commonly known as: FLOMAX Take 1 capsule (0.4 mg total) by mouth 2 (two) times daily. What changed: when to take this   Vitamin C 250 MG Chew Chew 250 mg by mouth daily.   Vitamin D3 50 MCG (2000 UT) Chew Chew 2,000 Units by mouth daily.        Outstanding Labs/Studies   None   Duration of Discharge Encounter   Greater than 30 minutes including physician time.  ATTENDING ATTESTATION:  After conducting a review of all available clinical information with the care team, interviewing the patient, and performing a physical exam, I agree with the findings and plan described in this note.  Patient doing well after uncomplicated TAVR  procedure with echocardiogram demonstrating a well functioning bioprosthesis, stable access sites, no conduction issues, and no clinically evident stroke.  Discharge today with follow-up, cardiac rehabilitation referral, and resume home medications.  Lenna Sciara, MD Pager [phone]   Signed, Kathyrn Drown, NP 07/17/2021, 9:13 AM [phone]

## 2021-07-17 NOTE — Progress Notes (Signed)
Patient given discharge instructions and stated understanding. 

## 2021-07-17 NOTE — Plan of Care (Signed)
  Problem: Education: Goal: Knowledge of General Education information will improve Description: Including pain rating scale, medication(s)/side effects and non-pharmacologic comfort measures Outcome: Adequate for Discharge   Problem: Health Behavior/Discharge Planning: Goal: Ability to manage health-related needs will improve Outcome: Adequate for Discharge   Problem: Clinical Measurements: Goal: Ability to maintain clinical measurements within normal limits will improve Outcome: Adequate for Discharge Goal: Will remain free from infection Outcome: Adequate for Discharge Goal: Diagnostic test results will improve Outcome: Adequate for Discharge Goal: Respiratory complications will improve Outcome: Adequate for Discharge Goal: Cardiovascular complication will be avoided Outcome: Adequate for Discharge   Problem: Activity: Goal: Risk for activity intolerance will decrease Outcome: Adequate for Discharge   Problem: Nutrition: Goal: Adequate nutrition will be maintained Outcome: Adequate for Discharge   Problem: Coping: Goal: Level of anxiety will decrease Outcome: Adequate for Discharge   Problem: Elimination: Goal: Will not experience complications related to bowel motility Outcome: Adequate for Discharge Goal: Will not experience complications related to urinary retention Outcome: Adequate for Discharge   Problem: Pain Managment: Goal: General experience of comfort will improve Outcome: Adequate for Discharge   Problem: Safety: Goal: Ability to remain free from injury will improve Outcome: Adequate for Discharge   Problem: Skin Integrity: Goal: Risk for impaired skin integrity will decrease Outcome: Adequate for Discharge   Problem: Education: Goal: Will demonstrate proper wound care and an understanding of methods to prevent future damage Outcome: Adequate for Discharge Goal: Knowledge of disease or condition will improve Outcome: Adequate for Discharge Goal:  Knowledge of the prescribed therapeutic regimen will improve Outcome: Adequate for Discharge Goal: Individualized Educational Video(s) Outcome: Adequate for Discharge   Problem: Activity: Goal: Risk for activity intolerance will decrease Outcome: Adequate for Discharge   Problem: Cardiac: Goal: Will achieve and/or maintain hemodynamic stability Outcome: Adequate for Discharge   Problem: Clinical Measurements: Goal: Postoperative complications will be avoided or minimized Outcome: Adequate for Discharge   Problem: Respiratory: Goal: Respiratory status will improve Outcome: Adequate for Discharge   Problem: Skin Integrity: Goal: Wound healing without signs and symptoms of infection Outcome: Adequate for Discharge Goal: Risk for impaired skin integrity will decrease Outcome: Adequate for Discharge   Problem: Urinary Elimination: Goal: Ability to achieve and maintain adequate renal perfusion and functioning will improve Outcome: Adequate for Discharge   

## 2021-07-17 NOTE — Progress Notes (Signed)
Pt discharged from 4E to home with wife. D/c instructions and medication education provided. Telemetry and IV removed.   Raelyn Number, RN

## 2021-07-17 NOTE — Anesthesia Postprocedure Evaluation (Signed)
Anesthesia Post Note  Patient: Oscar Bailey  Procedure(s) Performed: TRANSCATHETER AORTIC VALVE REPLACEMENT, TRANSFEMORAL USING A 26 EDWARDS AORTIC VALVE (Chest) TRANSESOPHAGEAL ECHOCARDIOGRAM (TEE)     Patient location during evaluation: PACU Anesthesia Type: MAC Level of consciousness: awake and alert Pain management: pain level controlled Vital Signs Assessment: post-procedure vital signs reviewed and stable Respiratory status: spontaneous breathing, nonlabored ventilation, respiratory function stable and patient connected to nasal cannula oxygen Cardiovascular status: stable and blood pressure returned to baseline Postop Assessment: no apparent nausea or vomiting Anesthetic complications: no   No notable events documented.  Last Vitals:  Vitals:   07/16/21 2307 07/17/21 0405  BP: 140/66 140/71  Pulse: 65 76  Resp: 20 18  Temp: 36.4 C 36.7 C  SpO2: 98% 93%    Last Pain:  Vitals:   07/17/21 0405  TempSrc: Oral  PainSc:                  Tiajuana Amass

## 2021-07-18 ENCOUNTER — Telehealth: Payer: Self-pay | Admitting: Physician Assistant

## 2021-07-18 NOTE — Telephone Encounter (Signed)
  [facility] VALVE TEAM   Patient contacted regarding discharge from [facility] on 10/5  Patient understands to follow up with provider Nell Range on 10/13 at [place].  Patient understands discharge instructions? yes Patient understands medications and regimen? yes Patient understands to bring all medications to this visit? yes  Angelena Form PA-C  [facility]

## 2021-07-19 LAB — POCT I-STAT, CHEM 8
BUN: 11 mg/dL (ref 8–23)
Calcium, Ion: 1.17 mmol/L (ref 1.15–1.40)
Chloride: 105 mmol/L (ref 98–111)
Creatinine, Ser: 0.8 mg/dL (ref 0.61–1.24)
Glucose, Bld: 119 mg/dL — ABNORMAL HIGH (ref 70–99)
HCT: 38 % — ABNORMAL LOW (ref 39.0–52.0)
Hemoglobin: 12.9 g/dL — ABNORMAL LOW (ref 13.0–17.0)
Potassium: 3.9 mmol/L (ref 3.5–5.1)
Sodium: 141 mmol/L (ref 135–145)
TCO2: 23 mmol/L (ref 22–32)

## 2021-07-19 LAB — POCT I-STAT 7, (LYTES, BLD GAS, ICA,H+H)
Acid-base deficit: 4 mmol/L — ABNORMAL HIGH (ref 0.0–2.0)
Bicarbonate: 22.6 mmol/L (ref 20.0–28.0)
Calcium, Ion: 1.25 mmol/L (ref 1.15–1.40)
HCT: 37 % — ABNORMAL LOW (ref 39.0–52.0)
Hemoglobin: 12.6 g/dL — ABNORMAL LOW (ref 13.0–17.0)
O2 Saturation: 99 %
Potassium: 3.8 mmol/L (ref 3.5–5.1)
Sodium: 141 mmol/L (ref 135–145)
TCO2: 24 mmol/L (ref 22–32)
pCO2 arterial: 45.1 mmHg (ref 32.0–48.0)
pH, Arterial: 7.308 — ABNORMAL LOW (ref 7.350–7.450)
pO2, Arterial: 134 mmHg — ABNORMAL HIGH (ref 83.0–108.0)

## 2021-07-24 ENCOUNTER — Ambulatory Visit: Payer: Medicare Other | Admitting: Physician Assistant

## 2021-07-24 MED FILL — Potassium Chloride Inj 2 mEq/ML: INTRAVENOUS | Qty: 40 | Status: AC

## 2021-07-24 MED FILL — Magnesium Sulfate Inj 50%: INTRAMUSCULAR | Qty: 10 | Status: AC

## 2021-07-24 MED FILL — Heparin Sodium (Porcine) Inj 1000 Unit/ML: Qty: 1000 | Status: AC

## 2021-07-25 ENCOUNTER — Ambulatory Visit: Payer: Medicare Other | Admitting: Physician Assistant

## 2021-07-25 ENCOUNTER — Ambulatory Visit: Payer: Medicare Other | Admitting: Cardiology

## 2021-07-25 ENCOUNTER — Encounter: Payer: Self-pay | Admitting: Cardiology

## 2021-07-25 ENCOUNTER — Other Ambulatory Visit: Payer: Self-pay

## 2021-07-25 VITALS — BP 140/76 | HR 82 | Ht 67.0 in | Wt 214.8 lb

## 2021-07-25 DIAGNOSIS — I350 Nonrheumatic aortic (valve) stenosis: Secondary | ICD-10-CM | POA: Diagnosis not present

## 2021-07-25 DIAGNOSIS — I10 Essential (primary) hypertension: Secondary | ICD-10-CM

## 2021-07-25 DIAGNOSIS — Z952 Presence of prosthetic heart valve: Secondary | ICD-10-CM | POA: Diagnosis not present

## 2021-07-25 DIAGNOSIS — I2510 Atherosclerotic heart disease of native coronary artery without angina pectoris: Secondary | ICD-10-CM | POA: Diagnosis not present

## 2021-07-25 DIAGNOSIS — E7841 Elevated Lipoprotein(a): Secondary | ICD-10-CM | POA: Diagnosis not present

## 2021-07-25 MED ORDER — AMOXICILLIN 500 MG PO TABS: ORAL_TABLET | ORAL | 3 refills | Status: DC

## 2021-07-25 NOTE — Progress Notes (Addendum)
HE[facility] AND [place]                                     Cardiology Office Note:    Date:  07/26/2021   ID:  Oscar Bailey, DOB [birth year], MRN [id number]  PCP:  Lois Huxley, PA  [facility] Cardiologist:  Mertie Moores, MD Dr. Lenna Sciara, MD & Dr. Gilford Raid, MD (TAVR)  Referring MD: Lois Huxley, [place]   Chief Complaint  Patient presents with   Follow-up    1 week s/p TAVR   History of Present Illness:    Oscar Bailey is a 70 y.o. male with a hx of arthritis, CAD w/PCI to diagonal in 2011, HTN, hyperlipidemia, hypothyroidism, prostate cancer and severe aortic stenosis who presents today for 1 week follow up after TAVR 07/16/21.    He is followed by Dr. Acie Fredrickson for his cardiology care and was seen back follow-up in 02/2021 and was reporting exertional fatigue and chest discomfort. A follow-up echo on 02/28/2021 showed an ejection fraction of 65 to 70%. The mean gradient across aortic valve had increased to 35 mmHg with a peak gradient of 63 mmHg. Aortic valve area was 0.87 cm with a dimensionless index of 0.27. Stroke-volume index was 33. He underwent cardiac catheterization on 04/01/2021 showing a patent diagonal stent and otherwise mild nonobstructive coronary disease. The mean gradient across aortic valve was measured at 23 mmHg with a peak gradient of 25 mmHg. A gated cardiac CTA was performed showing a severely calcified aortic valve with restricted leaflet mobility.  There was severe bulky calcification in the right coronary cusp.   The patient was evaluated by the multidisciplinary valve team and felt to have severe, symptomatic aortic stenosis and to be a suitable candidate for TAVR. He underwent successful TAVR with a 34mm Edwards Sapien 3 Ultra via the TF approach on 07/16/21. Post operative echo showed no significant PVL with a mean gradient at 69mmHg, peak 35 mmHg, DVI 0.69 and AVA 3.4 cm2. ECG showed NSR and no high grade  heart block. Plan was to continue on ASA 81mg  QD. BP was slightly elevated therefore he was resumed on home lisinopril 5mg  and metoprolol 12.5mg  BID.   He is here today an dis feeling great. He states he was raking leaves prior to his appointment today, an activity which used to cause him significant dyspnea. His fatigue has also improved since his procedure. He has been compliant with ASA and has had no issues with bleeding in his stool or urine. BP more stable today. Discussed SBE with him and will send to his pharmacy. He denies LE edema, orthopnea, dizziness, presyncopal episodes or syncope.   Past Medical History:  Diagnosis Date   Aortic stenosis    Echo 10/18: EF 55-60, normal wall motion, grade 1 diastolic dysfunction, moderate aortic stenosis (mean 20, peak 35) // Echocardiogram 5/22: EF 65-70, mod to severe AS (mean 35 mmHg, Vmax 396 cm/s, DI 0.25), trivial AI, normal RVSF, trivial MR, mild dilation of ascending aorta (36 mm)   Arthritis    FINGER S    CAD (coronary artery disease) 2011   Tx with DES ([place], [place]); Dr. Ofilia Neas in [place], [place] // [facility] 5/11 [facility]): Mid LAD 30, proximal D1 80, RCA 50 >> PCI: 2.25 x 20 mm taxus DES to the  D1   Heart murmur    History of echocardiogram    Echo 5/11 [facility]): EF 55   HLD (hyperlipidemia)    HTN (hypertension)    Hypothyroidism    hyperthyroidism >> s/p RAI treatment 2002   Malignant neoplasm of prostate ([place]) 06/01/2017   Myocardial infarction [facility]) 2011   STENT PLACED     Past Surgical History:  Procedure Laterality Date   CARDIAC CATHETERIZATION  2011   WITH STENT L DONE AT [facility] [place] N/A 09/24/2017   Procedure: CYSTOSCOPY;  Surgeon: Raynelle Bring, MD;  Location: [facility];  Service: Urology;  Laterality: N/A;   HERNIA REPAIR  2010   inguinal   JOINT REPLACEMENT Right 2013   Hip   LUMBAR LAMINECTOMY Right 2015   PROSTATE BIOPSY  04/2017    RADIOACTIVE SEED IMPLANT N/A 09/24/2017   Procedure: RADIOACTIVE SEED IMPLANT/BRACHYTHERAPY IMPLANT;  Surgeon: Raynelle Bring, MD;  Location: [facility];  Service: Urology;  Laterality: N/A;   RIGHT/LEFT HEART CATH AND CORONARY ANGIOGRAPHY N/A 04/01/2021   Procedure: RIGHT/LEFT HEART CATH AND CORONARY ANGIOGRAPHY;  Surgeon: Burnell Blanks, MD;  Location: [facility];  Service: Cardiovascular;  Laterality: N/A;   SPACE OAR INSTILLATION N/A 09/24/2017   Procedure: SPACE OAR INSTILLATION;  Surgeon: Raynelle Bring, MD;  Location: [facility];  Service: Urology;  Laterality: N/A;   TEE WITHOUT CARDIOVERSION N/A 07/16/2021   Procedure: TRANSESOPHAGEAL ECHOCARDIOGRAM (TEE);  Surgeon: Burnell Blanks, MD;  Location: [place];  Service: Open Heart Surgery;  Laterality: N/A;   TOTAL HIP ARTHROPLASTY Right 2013   TRANSCATHETER AORTIC VALVE REPLACEMENT, TRANSFEMORAL N/A 07/16/2021   Procedure: TRANSCATHETER AORTIC VALVE REPLACEMENT, TRANSFEMORAL USING A 26 EDWARDS AORTIC VALVE;  Surgeon: Burnell Blanks, MD;  Location: [place];  Service: Open Heart Surgery;  Laterality: N/A;    Current Medications: Current Meds  Medication Sig   amoxicillin (AMOXIL) 500 MG tablet Take FOUR tablets (2000 mg) by mouth one hour before any dental procedures   Ascorbic Acid (VITAMIN C) 250 MG CHEW Chew 250 mg by mouth daily.   aspirin EC 81 MG tablet Take 81 mg by mouth daily.   Cholecalciferol (VITAMIN D3) 50 MCG (2000 UT) CHEW Chew 2,000 Units by mouth daily.   ezetimibe (ZETIA) 10 MG tablet Take 10 mg by mouth daily.   fluticasone (FLONASE) 50 MCG/ACT nasal spray Place 2 sprays into both nostrils daily as needed for allergies or rhinitis.   levothyroxine (SYNTHROID) 125 MCG tablet Take 125 mcg by mouth daily before breakfast.   lisinopril (PRINIVIL,ZESTRIL) 5 MG tablet Take 5 mg by mouth daily.   metoprolol tartrate (LOPRESSOR) 25 MG tablet Take 12.5 mg by mouth 2 (two) times daily.    nitroGLYCERIN  (NITROSTAT) 0.4 MG SL tablet Place 1 tablet (0.4 mg total) under the tongue every 5 (five) minutes as needed for chest pain.   Polyethylene Glycol 400 (BLINK TEARS) 0.25 % SOLN Place 1 drop into both eyes 2 (two) times daily as needed (dry eyes).   rosuvastatin (CRESTOR) 40 MG tablet Take 40 mg by mouth daily.   tamsulosin (FLOMAX) 0.4 MG CAPS capsule Take 1 capsule (0.4 mg total) by mouth 2 (two) times daily. (Patient taking differently: Take 0.4 mg by mouth daily.)     Allergies:   Patient has no known allergies.   Social History   Socioeconomic History   Marital status: Single    Spouse name: Not on file  Number of children: 1   Years of education: Not on file   Highest education level: Not on file  Occupational History   Occupation: Air cabin crew for [employer]  Tobacco Use   Smoking status: Every Day    Packs/day: 0.75    Years: 55.00    Pack years: 41.25    Types: Cigarettes   Smokeless tobacco: Never  Vaping Use   Vaping Use: Never used  Substance and Sexual Activity   Alcohol use: Yes    Alcohol/week: 10.0 standard drinks    Types: 10 Cans of beer per week    Comment: 2-6 beers per day   Drug use: No   Sexual activity: Yes  Other Topics Concern   Not on file  Social History Narrative   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   Moved to [place] from [place], [place] in 6.2018   Engaged   1 son -20 yo (Master's Degree candidate in Engineer, mining at [employer])   Social Determinants of Health   Financial Resource Strain: Not on file  Food Insecurity: Not on file  Transportation Needs: Not on file  Physical Activity: Not on file  Stress: Not on file  Social Connections: Not on file     Family History: The patient's family history includes Alzheimer's disease in his mother; CAD in his father; Cancer in his brother; Dementia in his father; Heart attack in his paternal uncle.  ROS:   Please see the history of present illness.    All other systems  reviewed and are negative.  EKGs/Labs/Other Studies Reviewed:    The following studies were reviewed today:    TAVR OPERATIVE NOTE     Date of Procedure:                07/16/2021   Preoperative Diagnosis:      Severe Aortic Stenosis    Postoperative Diagnosis:    Same    Procedure:        Transcatheter Aortic Valve Replacement - Percutaneous right Transfemoral Approach             Edwards Sapien 3 Ultra THV (size 26 mm, model # 9750TFX, serial # [id number])              Co-Surgeons:                        Gaye Pollack, MD and Lauree Chandler, MD, and Lenna Sciara, MD     Anesthesiologist:                  Suzette Battiest, MD   Echocardiographer:              Jenkins Rouge, MD   Pre-operative Echo Findings: Severe aortic stenosis Normal left ventricular systolic function   Post-operative Echo Findings: No paravalvular leak Normal left ventricular systolic function   ____________  Echocardiogram 07/17/21:   1. Left ventricular ejection fraction, by estimation, is 60 to 65%. The  left ventricle has normal function. The left ventricle has no regional  wall motion abnormalities. There is mild left ventricular hypertrophy.  Left ventricular diastolic parameters  were normal.   2. Right ventricular systolic function is normal. The right ventricular  size is normal.   3. Left atrial size was moderately dilated.   4. The mitral valve is normal in structure. No evidence of mitral valve  regurgitation. No evidence of mitral stenosis.   5. Post TAVR with 26  mm Sapien 3 valve no significant PVL mean gradient  16 peak 35 mmHg DVI 0.69 and AVA 3.4 cm2 . The aortic valve has been  repaired/replaced. Aortic valve regurgitation is not visualized. No aortic  stenosis is present. There is a 26 mm   Sapien prosthetic (TAVR) valve present in the aortic position. Procedure  Date: 07/16/21.   6. The inferior vena cava is normal in size with greater than 50%  respiratory  variability, suggesting right atrial pressure of 3 mmHg.   EKG:  EKG is ordered today.  The ekg ordered today demonstrates NSR with no new RBBB/LBBB, HR 82bpm   Recent Labs: 07/12/2021: ALT 34 07/17/2021: BUN 14; Creatinine, Ser 1.03; Hemoglobin 13.1; Platelets 142; Potassium 3.7; Sodium 135  Recent Lipid Panel No results found for: CHOL, TRIG, HDL, CHOLHDL, VLDL, LDLCALC, LDLDIRECT  Physical Exam:    VS:  BP 140/76   Pulse 82   Ht 5\' 7"  (1.702 m)   Wt 214 lb 12.8 oz (97.4 kg)   SpO2 95%   BMI 33.64 kg/m     Wt Readings from Last 3 Encounters:  07/25/21 214 lb 12.8 oz (97.4 kg)  07/17/21 207 lb 7.3 oz (94.1 kg)  07/12/21 214 lb (97.1 kg)    General: Well developed, well nourished, NAD Lungs:Clear to ausculation bilaterally. Breathing is unlabored. Cardiovascular: RRR with S1 S2. No murmurs Extremities: No edema. Groin sites stable with no s/s of bleeding or hematoma  Neuro: Alert and oriented. No focal deficits. No facial asymmetry. MAE spontaneously. Psych: Responds to questions appropriately with normal affect.    ASSESSMENT/PLAN:    1. Severe AS:  -s/p successful TAVR with a 13mm Edwards Sapien 3 Ultra via the TF approach on 07/16/21. Post procedure echocardiogram with no significant PVL mean gradient  16 peak 35 mmHg DVI 0.69 and AVA 3.4 cm2. EKG with NSR and no high grade heart block. Plan is to continue on ASA 81mg  QD indefinitely. Discussed the need for SBE and will send amoxicillin 2g PO 1 hour prior to dental work to his pharmacy. Plan for 1 month follow up with echo prior.    2. CAD s/p PCI to D1 2011: -Hx of MI in 2011 tx with DES to D1 in [place], [place].  POET in 2018 was low risk. Denies anginal symptoms. Continue ASA, beta-blocker, statin.   3. HLD: -Continue statin therapy   4. HTN: -Stable, 140/76. Continue current regimen    Medication Adjustments/Labs and Tests Ordered: Current medicines are reviewed at length with the patient today.  Concerns regarding  medicines are outlined above.  Orders Placed This Encounter  Procedures   EKG 12-Lead    Meds ordered this encounter  Medications   amoxicillin (AMOXIL) 500 MG tablet    Sig: Take FOUR tablets (2000 mg) by mouth one hour before any dental procedures    Dispense:  12 tablet    Refill:  3     Patient Instructions  Medication Instructions:  1.) amoxicillin 500 mg --take 4 tablets (2000 mg) by mouth [place]  *If you need a refill on your cardiac medications before your next appointment, please call your pharmacy*   Lab Work: none   Testing/Procedures: As planned   Follow-Up: As planned   Other Instructions     Signed, Kathyrn Drown, NP  07/26/2021 10:14 AM    [place]

## 2021-07-25 NOTE — Patient Instructions (Signed)
Medication Instructions:  1.) amoxicillin 500 mg --take 4 tablets (2000 mg) by mouth ONE HOUR BEFORE ANY DENTAL PROCEDURES  *If you need a refill on your cardiac medications before your next appointment, please call your pharmacy*   Lab Work: none   Testing/Procedures: As planned   Follow-Up: As planned   Other Instructions

## 2021-08-14 ENCOUNTER — Encounter: Payer: Self-pay | Admitting: Physician Assistant

## 2021-08-14 ENCOUNTER — Other Ambulatory Visit: Payer: Self-pay

## 2021-08-14 ENCOUNTER — Ambulatory Visit: Payer: Medicare Other | Admitting: Physician Assistant

## 2021-08-14 ENCOUNTER — Ambulatory Visit (HOSPITAL_COMMUNITY): Payer: Medicare Other | Attending: Cardiology

## 2021-08-14 VITALS — BP 130/60 | HR 62 | Ht 67.0 in | Wt 205.0 lb

## 2021-08-14 DIAGNOSIS — E782 Mixed hyperlipidemia: Secondary | ICD-10-CM | POA: Diagnosis not present

## 2021-08-14 DIAGNOSIS — I10 Essential (primary) hypertension: Secondary | ICD-10-CM

## 2021-08-14 DIAGNOSIS — Z952 Presence of prosthetic heart valve: Secondary | ICD-10-CM | POA: Insufficient documentation

## 2021-08-14 DIAGNOSIS — I2510 Atherosclerotic heart disease of native coronary artery without angina pectoris: Secondary | ICD-10-CM

## 2021-08-14 LAB — ECHOCARDIOGRAM COMPLETE
AR max vel: 1.46 cm2
AV Area VTI: 1.45 cm2
AV Area mean vel: 1.37 cm2
AV Mean grad: 13 mmHg
AV Peak grad: 26.2 mmHg
Ao pk vel: 2.56 m/s
Area-P 1/2: 3.11 cm2
S' Lateral: 2.9 cm

## 2021-08-14 NOTE — Patient Instructions (Signed)
Medication Instructions:  Your physician recommends that you continue on your current medications as directed. Please refer to the Current Medication list given to you today.  *If you need a refill on your cardiac medications before your next appointment, please call your pharmacy*   Lab Work: None ordered   If you have labs (blood work) drawn today and your tests are completely normal, you will receive your results only by: [place] (if you have MyChart) OR A paper copy in the mail If you have any lab test that is abnormal or we need to change your treatment, we will call you to review the results.   Testing/Procedures: None ordered    Follow-Up: At [facility], you and your health needs are our priority.  As part of our continuing mission to provide you with exceptional heart care, we have created designated Provider Care Teams.  These Care Teams include your primary Cardiologist (physician) and Advanced Practice Providers (APPs -  Physician Assistants and Nurse Practitioners) who all work together to provide you with the care you need, when you need it.  We recommend signing up for the patient portal called "MyChart".  Sign up information is provided on this After Visit Summary.  MyChart is used to connect with patients for Virtual Visits (Telemedicine).  Patients are able to view lab/test results, encounter notes, upcoming appointments, etc.  Non-urgent messages can be sent to your provider as well.   To learn more about what you can do with MyChart, go to [email or web address].    Your next appointment:   6 month(s)  The format for your next appointment:   In Person  Provider:   You may see Mertie Moores, MD or one of the following Advanced Practice Providers on your designated Care Team:   [name], PA-C Vin Turner, [place]  Someone from the structural team will contact you in 1 year for follow up   Other Instructions None

## 2021-08-14 NOTE — Progress Notes (Signed)
[facility] [place]                                     Cardiology Office Note:    Date:  08/14/2021   ID:  [name], DOB 1951-01-31, MRN [id number]  PCP:  Lois Huxley, PA  [facility] Cardiologist:  Mertie Moores, MD / Dr. Lenna Sciara, MD & Dr. Gilford Raid, MD (TAVR)  Referring MD: Lois Huxley, PA   1 month s/p TAVR  History of Present Illness:    Oscar Bailey is a 70 y.o. male with a hx of arthritis, CAD w/PCI to diagonal in 2011, HTN, HLD, hypothyroidism, prostate cancer and severe aortic stenosis s/p TAVR (07/16/21) who presents to clinic for follow up.    He is followed by Dr. Acie Fredrickson for his cardiology care and was seen back follow-up in 02/2021 and was reporting exertional fatigue and chest discomfort. A follow-up echo on 02/28/2021 showed an ejection fraction of 65 to 70%. The mean gradient across aortic valve had increased to 35 mmHg with a peak gradient of 63 mmHg. Aortic valve area was 0.87 cm with a dimensionless index of 0.27. Stroke-volume index was 33. He underwent cardiac catheterization on 04/01/2021 showing a patent diagonal stent and otherwise mild nonobstructive coronary disease. The mean gradient across aortic valve was measured at 23 mmHg with a peak gradient of 25 mmHg. A gated cardiac CTA was performed showing a severely calcified aortic valve with restricted leaflet mobility.  There was severe bulky calcification in the right coronary cusp.   The patient was evaluated by the multidisciplinary valve team and underwent successful TAVR with a 8mm Edwards Sapien 3 Ultra via the TF approach on 07/16/21. Post operative echo showed EF 60% with normally functioning TAVR valve with no significant PVL with a mean gradient at 78mmHg, peak 35 mmHg, DVI 0.69 and AVA 3.4 cm2. Plan was to continue on ASA 81mg  QD. He has done quite well in follow up with a big symptomatic improvement.   Today the patient presents to clinic  for follow up. No CP or SOB. No LE edema, orthopnea or PND. No dizziness or syncope. No blood in stool or urine. No palpitations. Went to [employer] last week and felt great. Feels like a "new person."   Past Medical History:  Diagnosis Date   Aortic stenosis    Echo 10/18: EF 55-60, normal wall motion, grade 1 diastolic dysfunction, moderate aortic stenosis (mean 20, peak 35) // Echocardiogram 5/22: EF 65-70, mod to severe AS (mean 35 mmHg, Vmax 396 cm/s, DI 0.25), trivial AI, normal RVSF, trivial MR, mild dilation of ascending aorta (36 mm)   Arthritis    FINGER S    CAD (coronary artery disease) 2011   Tx with DES ([place], [place]); Dr. Ofilia Neas in [place], [place] // [facility] 5/11 [facility]): Mid LAD 30, proximal D1 80, RCA 50 >> PCI: 2.25 x 20 mm taxus DES to the D1   Heart murmur    History of echocardiogram    Echo 5/11 [facility]): EF 55   HLD (hyperlipidemia)    HTN (hypertension)    Hypothyroidism    hyperthyroidism >> s/p RAI treatment 2002   Malignant neoplasm of prostate ([place]) 06/01/2017   Myocardial infarction ([place]) 2011   STENT PLACED     Past  Surgical History:  Procedure Laterality Date   CARDIAC CATHETERIZATION  2011   WITH STENT L DONE AT [facility] IN [place]    CYSTOSCOPY N/A 09/24/2017   Procedure: CYSTOSCOPY;  Surgeon: Raynelle Bring, MD;  Location: [facility];  Service: Urology;  Laterality: N/A;   HERNIA REPAIR  2010   inguinal   JOINT REPLACEMENT Right 2013   Hip   LUMBAR LAMINECTOMY Right 2015   PROSTATE BIOPSY  04/2017   RADIOACTIVE SEED IMPLANT N/A 09/24/2017   Procedure: RADIOACTIVE SEED IMPLANT/BRACHYTHERAPY IMPLANT;  Surgeon: Raynelle Bring, MD;  Location: [facility];  Service: Urology;  Laterality: N/A;   RIGHT/LEFT HEART CATH AND CORONARY ANGIOGRAPHY N/A 04/01/2021   Procedure: RIGHT/LEFT HEART CATH AND CORONARY ANGIOGRAPHY;  Surgeon: Burnell Blanks, MD;  Location: [facility];  Service:  Cardiovascular;  Laterality: N/A;   SPACE OAR INSTILLATION N/A 09/24/2017   Procedure: SPACE OAR INSTILLATION;  Surgeon: Raynelle Bring, MD;  Location: [facility];  Service: Urology;  Laterality: N/A;   TEE WITHOUT CARDIOVERSION N/A 07/16/2021   Procedure: TRANSESOPHAGEAL ECHOCARDIOGRAM (TEE);  Surgeon: Burnell Blanks, MD;  Location: [place];  Service: Open Heart Surgery;  Laterality: N/A;   TOTAL HIP ARTHROPLASTY Right 2013   TRANSCATHETER AORTIC VALVE REPLACEMENT, TRANSFEMORAL N/A 07/16/2021   Procedure: TRANSCATHETER AORTIC VALVE REPLACEMENT, TRANSFEMORAL USING A 26 EDWARDS AORTIC VALVE;  Surgeon: Burnell Blanks, MD;  Location: [facility];  Service: Open Heart Surgery;  Laterality: N/A;    Current Medications: Current Meds  Medication Sig   amoxicillin (AMOXIL) 500 MG tablet Take FOUR tablets (2000 mg) by mouth one hour before any dental procedures   Ascorbic Acid (VITAMIN C) 250 MG CHEW Chew 250 mg by mouth daily.   aspirin EC 81 MG tablet Take 81 mg by mouth daily.   Cholecalciferol (VITAMIN D3) 50 MCG (2000 UT) CHEW Chew 2,000 Units by mouth daily.   ezetimibe (ZETIA) 10 MG tablet Take 10 mg by mouth daily.   fluticasone (FLONASE) 50 MCG/ACT nasal spray Place 2 sprays into both nostrils daily as needed for allergies or rhinitis.   levothyroxine (SYNTHROID) 125 MCG tablet Take 125 mcg by mouth daily before breakfast.   lisinopril (PRINIVIL,ZESTRIL) 5 MG tablet Take 5 mg by mouth daily.   metoprolol tartrate (LOPRESSOR) 25 MG tablet Take 12.5 mg by mouth 2 (two) times daily.    nitroGLYCERIN (NITROSTAT) 0.4 MG SL tablet Place 1 tablet (0.4 mg total) under the tongue every 5 (five) minutes as needed for chest pain.   Polyethylene Glycol 400 (BLINK TEARS) 0.25 % SOLN Place 1 drop into both eyes 2 (two) times daily as needed (dry eyes).   rosuvastatin (CRESTOR) 40 MG tablet Take 40 mg by mouth daily.   tamsulosin (FLOMAX) 0.4 MG CAPS capsule Take 1 capsule (0.4 mg total) by mouth 2  (two) times daily.     Allergies:   Patient has no known allergies.   Social History   Socioeconomic History   Marital status: Single    Spouse name: Not on file   Number of children: 1   Years of education: Not on file   Highest education level: Not on file  Occupational History   Occupation: Air cabin crew for [employer]  Tobacco Use   Smoking status: Every Day    Packs/day: 0.75    Years: 55.00    Pack years: 41.25    Types: Cigarettes   Smokeless tobacco: Never  Vaping Use   Vaping Use: Never  used  Substance and Sexual Activity   Alcohol use: Yes    Alcohol/week: 10.0 standard drinks    Types: 10 Cans of beer per week    Comment: 2-6 beers per day   Drug use: No   Sexual activity: Yes  Other Topics Concern   Not on file  Social History Narrative   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   Moved to [place] from [place], [place] in 6.2018   Engaged   1 son -37 yo (Master's Degree candidate in Engineer, mining at [employer])   Social Determinants of Health   Financial Resource Strain: Not on file  Food Insecurity: Not on file  Transportation Needs: Not on file  Physical Activity: Not on file  Stress: Not on file  Social Connections: Not on file     Family History: The patient's family history includes Alzheimer's disease in his mother; CAD in his father; Cancer in his brother; Dementia in his father; Heart attack in his paternal uncle.  ROS:   Please see the history of present illness.    All other systems reviewed and are negative.  EKGs/Labs/Other Studies Reviewed:    The following studies were reviewed today:    TAVR OPERATIVE NOTE     Date of Procedure:                07/16/2021   Preoperative Diagnosis:      Severe Aortic Stenosis    Postoperative Diagnosis:    Same    Procedure:        Transcatheter Aortic Valve Replacement - Percutaneous right Transfemoral Approach             Edwards Sapien 3 Ultra THV (size 26 mm, model #  9750TFX, serial # [id number])              Co-Surgeons:                        Gaye Pollack, MD and Lauree Chandler, MD, and Lenna Sciara, MD     Anesthesiologist:                  Suzette Battiest, MD   Echocardiographer:              Jenkins Rouge, MD   Pre-operative Echo Findings: Severe aortic stenosis Normal left ventricular systolic function   Post-operative Echo Findings: No paravalvular leak Normal left ventricular systolic function   ____________  Echocardiogram 07/17/21:   1. Left ventricular ejection fraction, by estimation, is 60 to 65%. The  left ventricle has normal function. The left ventricle has no regional  wall motion abnormalities. There is mild left ventricular hypertrophy.  Left ventricular diastolic parameters  were normal.   2. Right ventricular systolic function is normal. The right ventricular  size is normal.   3. Left atrial size was moderately dilated.   4. The mitral valve is normal in structure. No evidence of mitral valve  regurgitation. No evidence of mitral stenosis.   5. Post TAVR with 26 mm Sapien 3 valve no significant PVL mean gradient  16 peak 35 mmHg DVI 0.69 and AVA 3.4 cm2 . The aortic valve has been  repaired/replaced. Aortic valve regurgitation is not visualized. No aortic  stenosis is present. There is a 26 mm   Sapien prosthetic (TAVR) valve present in the aortic position. Procedure  Date: 07/16/21.   6. The inferior vena cava is normal in size with greater than  50%  respiratory variability, suggesting right atrial pressure of 3 mmHg.   _________________________  Echo 08/14/21 IMPRESSIONS   1. Left ventricular ejection fraction, by estimation, is 60 to 65%. The left ventricle has normal function. The left ventricle has no regional wall motion abnormalities.  2. Right ventricular systolic function is normal. The right ventricular size is normal.  3. The mitral valve is normal in structure. No evidence of mitral valve  regurgitation. No evidence of mitral stenosis.  4. Trivial perivalvular leak . The aortic valve is normal in structure. Aortic valve regurgitation is trivial. No aortic stenosis is present. There is a 26 mm Sapien prosthetic (TAVR) valve present in the aortic position. Procedure Date: 07/16/2021.  Echo findings are consistent with normal structure and function of the aortic valve prosthesis. Aortic valve area, by VTI measures 1.45 cm. Aortic valve mean gradient measures 13.0 mmHg. Aortic valve Vmax measures 2.56 m/s.  5. The inferior vena cava is normal in size with greater than 50% respiratory variability, suggesting right atrial pressure of 3 mmHg.  EKG:  EKG is NOT ordered today.    Recent Labs: 07/12/2021: ALT 34 07/17/2021: BUN 14; Creatinine, Ser 1.03; Hemoglobin 13.1; Platelets 142; Potassium 3.7; Sodium 135  Recent Lipid Panel No results found for: CHOL, TRIG, HDL, CHOLHDL, VLDL, LDLCALC, LDLDIRECT  Physical Exam:    VS:  BP 130/60 (BP Location: Left Arm, Patient Position: Sitting, Cuff Size: Normal)   Pulse 62   Ht 5\' 7"  (1.702 m)   Wt 205 lb (93 kg)   SpO2 97%   BMI 32.11 kg/m     Wt Readings from Last 3 Encounters:  08/14/21 205 lb (93 kg)  07/25/21 214 lb 12.8 oz (97.4 kg)  07/17/21 207 lb 7.3 oz (94.1 kg)    General: Well developed, well nourished, NAD Lungs:Clear to ausculation bilaterally. Breathing is unlabored. Cardiovascular: RRR with S1 S2. No murmurs Extremities: No edema. Neuro: Alert and oriented. No focal deficits. No facial asymmetry. MAE spontaneously. Psych: Responds to questions appropriately with normal affect.    ASSESSMENT/PLAN:    Severe AS s/p TAVR: echo today shows EF 60%, normally functioning TAVR with a mean gradient of 13 mm hg and trivial PVL. He has NYHA class I symptoms. He has amoxicillin for SBE prophylaxis. Continue on aspirin 81 mg daily. I will see him back in 1 year for follow up and echo.   CAD s/p PCI to D1 2011: pre TAVR cardiac  catheterization on 04/01/2021 showing a patent diagonal stent and otherwise mild nonobstructive coronary disease.. Continue ASA, beta-blocker, statin.   HLD: continue statin therapy   HTN: BP well controlled today. No changes made  Medication Adjustments/Labs and Tests Ordered: Current medicines are reviewed at length with the patient today.  Concerns regarding medicines are outlined above.  No orders of the defined types were placed in this encounter.   No orders of the defined types were placed in this encounter.    Patient Instructions  Medication Instructions:  Your physician recommends that you continue on your current medications as directed. Please refer to the Current Medication list given to you today.  *If you need a refill on your cardiac medications before your next appointment, please call your pharmacy*   Lab Work: None ordered   If you have labs (blood work) drawn today and your tests are completely normal, you will receive your results only by: [place] (if you have MyChart) OR A paper copy in the mail If you have  any lab test that is abnormal or we need to change your treatment, we will call you to review the results.   Testing/Procedures: None ordered    Follow-Up: At [facility], you and your health needs are our priority.  As part of our continuing mission to provide you with exceptional heart care, we have created designated Provider Care Teams.  These Care Teams include your primary Cardiologist (physician) and Advanced Practice Providers (APPs -  Physician Assistants and Nurse Practitioners) who all work together to provide you with the care you need, when you need it.  We recommend signing up for the patient portal called "MyChart".  Sign up information is provided on this After Visit Summary.  MyChart is used to connect with patients for Virtual Visits (Telemedicine).  Patients are able to view lab/test results, encounter notes, upcoming  appointments, etc.  Non-urgent messages can be sent to your provider as well.   To learn more about what you can do with MyChart, go to [email or web address].    Your next appointment:   6 month(s)  The format for your next appointment:   In Person  Provider:   You may see Mertie Moores, MD or one of the following Advanced Practice Providers on your designated Care Team:   [name], PA-C Robbie Lis, [place]  Someone from the structural team will contact you in 1 year for follow up   Other Instructions None     Signed, Angelena Form, PA-C  08/14/2021 4:04 PM    [place]

## 2021-08-23 DIAGNOSIS — R3912 Poor urinary stream: Secondary | ICD-10-CM | POA: Diagnosis not present

## 2021-09-13 DIAGNOSIS — Z23 Encounter for immunization: Secondary | ICD-10-CM | POA: Diagnosis not present

## 2021-09-13 DIAGNOSIS — Z1389 Encounter for screening for other disorder: Secondary | ICD-10-CM | POA: Diagnosis not present

## 2021-09-13 DIAGNOSIS — E039 Hypothyroidism, unspecified: Secondary | ICD-10-CM | POA: Diagnosis not present

## 2021-09-13 DIAGNOSIS — Z0000 Encounter for general adult medical examination without abnormal findings: Secondary | ICD-10-CM | POA: Diagnosis not present

## 2021-09-13 DIAGNOSIS — I2510 Atherosclerotic heart disease of native coronary artery without angina pectoris: Secondary | ICD-10-CM | POA: Diagnosis not present

## 2021-09-13 DIAGNOSIS — I10 Essential (primary) hypertension: Secondary | ICD-10-CM | POA: Diagnosis not present

## 2021-09-13 DIAGNOSIS — Z720 Tobacco use: Secondary | ICD-10-CM | POA: Diagnosis not present

## 2021-09-13 DIAGNOSIS — Z1211 Encounter for screening for malignant neoplasm of colon: Secondary | ICD-10-CM | POA: Diagnosis not present

## 2021-09-13 DIAGNOSIS — E7800 Pure hypercholesterolemia, unspecified: Secondary | ICD-10-CM | POA: Diagnosis not present

## 2022-01-20 DIAGNOSIS — M5451 Vertebrogenic low back pain: Secondary | ICD-10-CM | POA: Diagnosis not present

## 2022-02-26 DIAGNOSIS — R3912 Poor urinary stream: Secondary | ICD-10-CM | POA: Diagnosis not present

## 2022-03-19 DIAGNOSIS — Z1211 Encounter for screening for malignant neoplasm of colon: Secondary | ICD-10-CM | POA: Diagnosis not present

## 2022-03-19 DIAGNOSIS — E7800 Pure hypercholesterolemia, unspecified: Secondary | ICD-10-CM | POA: Diagnosis not present

## 2022-03-19 DIAGNOSIS — E039 Hypothyroidism, unspecified: Secondary | ICD-10-CM | POA: Diagnosis not present

## 2022-03-19 DIAGNOSIS — I10 Essential (primary) hypertension: Secondary | ICD-10-CM | POA: Diagnosis not present

## 2022-03-19 DIAGNOSIS — Z720 Tobacco use: Secondary | ICD-10-CM | POA: Diagnosis not present

## 2022-04-24 ENCOUNTER — Encounter: Payer: Self-pay | Admitting: Cardiovascular Disease

## 2022-04-24 NOTE — Progress Notes (Signed)
Cardiology Office Note:    Date:  04/25/2022   ID:  Oscar Bailey, DOB 28-Jun-1951, MRN [id number]  PCP:  Oscar Huxley, PA  Cardiologist:  Oscar Moores, MD  Electrophysiologist:  None   Referring MD: Oscar Huxley, PA   Problem list 1.  Coronary artery disease-status post myocardial infarction in 2011 with DES placement at [facility] [place] 2.  Hypertension 3.  Hyperlipidemia 4.  Hypothyroidism-status post radioactive iodine 5.  Prostate cancer 6.    Chief Complaint  Patient presents with   Hyperlipidemia    Previous notes. :    Oscar Bailey is a 71 y.o. male with a hx of coronary artery disease, hypertension, hyperlipidemia. My first visit with Oscar Bailey.  He was previously seen by Oscar Dopp, PA.  He was seen here for pre-op prostate surgery  ( seed implants )   No CP , no dyspnea  He had an abdominal ultrasound performed in October, 2019.  Field very mild aortic ectasia measuring 2.5 cm in maximal diameter.  Left ventricular systolic function is normal with an EF of 55 to 60% by echo in October, 2018. He has moderate aortic stenosis with a mean aortic valve gradient of 20 mmHg.  Has done well over the year.  Walks his dogs  No CP or dyspnea with normal activities  Jan. 18, 2021: Oscar Bailey is a 71 year old gentleman with a history of CAD, hypertension, hyperlipidemia.  Blood work from Oct. 2020 from  his primary medical doctor reveals a total cholesterol of 204.  The triglyceride level is 254.  The HDL is 57.  The LDL is 96.  The atorvastatin was increased to 80 mg a day because of this lab value.  TSH was 0.42.  Basic metabolic profile and liver enzymes were within normal limits.  CBC was normal.  No CP or dyspnea.    April 25, 2022 Oscar Bailey is seen for follow up of his CAD HTN, HLD   BP is mildly elevated  No cp , no dyspnea  Had a TAVR in Oct. 2022 Feeling well   Labs from [place] from March 19, 2022 reveal: Total  cholesterol is 133 HDL is 47 LDL is 54 Triglyceride level is 199   Past Medical History:  Diagnosis Date   Aortic stenosis    Echo 10/18: EF 55-60, normal wall motion, grade 1 diastolic dysfunction, moderate aortic stenosis (mean 20, peak 35) // Echocardiogram 5/22: EF 65-70, mod to severe AS (mean 35 mmHg, Vmax 396 cm/s, DI 0.25), trivial AI, normal RVSF, trivial MR, mild dilation of ascending aorta (36 mm)   Arthritis    FINGER S    CAD (coronary artery disease) 2011   Tx with DES ([place], [place]); Dr. Ofilia Neas in [place], [place] // [facility] 5/11 [facility]): Mid LAD 30, proximal D1 80, RCA 50 >> PCI: 2.25 x 20 mm taxus DES to the D1   Heart murmur    History of echocardiogram    Echo 5/11 [facility]): EF 55   HLD (hyperlipidemia)    HTN (hypertension)    Hypothyroidism    hyperthyroidism >> s/p RAI treatment 2002   Malignant neoplasm of prostate ([place]) 06/01/2017   Myocardial infarction [facility]) 2011   STENT PLACED     Past Surgical History:  Procedure Laterality Date   CARDIAC CATHETERIZATION  2011   WITH STENT L DONE AT [facility] IN [place]    CYSTOSCOPY N/A  09/24/2017   Procedure: CYSTOSCOPY;  Surgeon: Raynelle Bring, MD;  Location: [facility];  Service: Urology;  Laterality: N/A;   HERNIA REPAIR  2010   inguinal   JOINT REPLACEMENT Right 2013   Hip   LUMBAR LAMINECTOMY Right 2015   PROSTATE BIOPSY  04/2017   RADIOACTIVE SEED IMPLANT N/A 09/24/2017   Procedure: RADIOACTIVE SEED IMPLANT/BRACHYTHERAPY IMPLANT;  Surgeon: Raynelle Bring, MD;  Location: [facility];  Service: Urology;  Laterality: N/A;   RIGHT/LEFT HEART CATH AND CORONARY ANGIOGRAPHY N/A 04/01/2021   Procedure: RIGHT/LEFT HEART CATH AND CORONARY ANGIOGRAPHY;  Surgeon: Burnell Blanks, MD;  Location: [place] [facility];  Service: Cardiovascular;  Laterality: N/A;   SPACE OAR INSTILLATION N/A 09/24/2017   Procedure: SPACE OAR INSTILLATION;  Surgeon: Raynelle Bring,  MD;  Location: [facility];  Service: Urology;  Laterality: N/A;   TEE WITHOUT CARDIOVERSION N/A 07/16/2021   Procedure: TRANSESOPHAGEAL ECHOCARDIOGRAM (TEE);  Surgeon: Burnell Blanks, MD;  Location: [place];  Service: Open Heart Surgery;  Laterality: N/A;   TOTAL HIP ARTHROPLASTY Right 2013   TRANSCATHETER AORTIC VALVE REPLACEMENT, TRANSFEMORAL N/A 07/16/2021   Procedure: TRANSCATHETER AORTIC VALVE REPLACEMENT, TRANSFEMORAL USING A 26 EDWARDS AORTIC VALVE;  Surgeon: Burnell Blanks, MD;  Location: [place];  Service: Open Heart Surgery;  Laterality: N/A;    Current Medications: Current Meds  Medication Sig   amoxicillin (AMOXIL) 500 MG tablet Take FOUR tablets (2000 mg) by mouth one hour before any dental procedures   aspirin EC 81 MG tablet Take 81 mg by mouth daily.   ezetimibe (ZETIA) 10 MG tablet Take 10 mg by mouth daily.   levothyroxine (SYNTHROID) 125 MCG tablet Take 125 mcg by mouth daily before breakfast.   lisinopril (PRINIVIL,ZESTRIL) 5 MG tablet Take 5 mg by mouth daily.   metoprolol tartrate (LOPRESSOR) 25 MG tablet Take 12.5 mg by mouth 2 (two) times daily.    Polyethylene Glycol 400 (BLINK TEARS) 0.25 % SOLN Place 1 drop into both eyes 2 (two) times daily as needed (dry eyes).   rosuvastatin (CRESTOR) 40 MG tablet Take 40 mg by mouth daily.   [DISCONTINUED] nitroGLYCERIN (NITROSTAT) 0.4 MG SL tablet Place 1 tablet (0.4 mg total) under the tongue every 5 (five) minutes as needed for chest pain.     Allergies:   Patient has no known allergies.   Social History   Socioeconomic History   Marital status: Single    Spouse name: Not on file   Number of children: 1   Years of education: Not on file   Highest education level: Not on file  Occupational History   Occupation: Air cabin crew for [employer]  Tobacco Use   Smoking status: Every Day    Packs/day: 0.75    Years: 55.00    Total pack years: 41.25    Types: Cigarettes   Smokeless tobacco: Never   Vaping Use   Vaping Use: Never used  Substance and Sexual Activity   Alcohol use: Yes    Alcohol/week: 10.0 standard drinks of alcohol    Types: 10 Cans of beer per week    Comment: 2-6 beers per day   Drug use: No   Sexual activity: Yes  Other Topics Concern   Not on file  Social History Narrative   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   Moved to [place] from [place], [place] in 6.2018   Engaged   1 son -3 yo (Master's Degree candidate in Engineer, mining at [employer])  Social Determinants of Health   Financial Resource Strain: Not on file  Food Insecurity: Not on file  Transportation Needs: Not on file  Physical Activity: Not on file  Stress: Not on file  Social Connections: Not on file     Family History: The patient's family history includes Alzheimer's disease in his mother; CAD in his father; Cancer in his brother; Dementia in his father; Heart attack in his paternal uncle.  ROS:   Please see the history of present illness.     All other systems reviewed and are negative.  EKGs/Labs/Other Studies Reviewed:    The following studies were reviewed today:   EKG:       Recent Labs: 07/12/2021: ALT 34 07/17/2021: BUN 14; Creatinine, Ser 1.03; Hemoglobin 13.1; Platelets 142; Potassium 3.7; Sodium 135  Recent Lipid Panel No results found for: "CHOL", "TRIG", "HDL", "CHOLHDL", "VLDL", "LDLCALC", "LDLDIRECT"  Physical Exam:    Physical Exam: Blood pressure 134/78, pulse 72, height '5\' 7"'$  (1.702 m), weight 212 lb 6.4 oz (96.3 kg), SpO2 97 %.  GEN:  Well nourished, well developed in no acute distress HEENT: Normal NECK: No JVD; No carotid bruits LYMPHATICS: No lymphadenopathy CARDIAC: RRR ,  very soft systolic murmur  RESPIRATORY:  Clear to auscultation without rales, wheezing or rhonchi  ABDOMEN: Soft, non-tender, non-distended MUSCULOSKELETAL:  No edema; No deformity  SKIN: Warm and dry NEUROLOGIC:  Alert and oriented x 3     ASSESSMENT:    1. Coronary  artery disease involving native coronary artery of native heart, unspecified whether angina present   2. S/P TAVR (transcatheter aortic valve replacement)   3. Primary hypertension     PLAN:      1.    CAD -   no angina.  Will refill his NTG    2.  Aortic stenosis.  - s/p TAVR.  Feels great .  Valve sounds great .    3.  Hyperlipidemia: liipds from [place] were reviewed ( from [facility]) and look ok.  Trigs are elevated.   I advised him to reduce his intake of carbs.     Medication Adjustments/Labs and Tests Ordered: Current medicines are reviewed at length with the patient today.  Concerns regarding medicines are outlined above.  No orders of the defined types were placed in this encounter.  Meds ordered this encounter  Medications   nitroGLYCERIN (NITROSTAT) 0.4 MG SL tablet    Sig: Place 1 tablet (0.4 mg total) under the tongue every 5 (five) minutes as needed for chest pain.    Dispense:  25 tablet    Refill:  6     Patient Instructions  Medication Instructions:  Refilled Nitroglycerin *If you need a refill on your cardiac medications before your next appointment, please call your pharmacy*   Lab Work: NONE If you have labs (blood work) drawn today and your tests are completely normal, you will receive your results only by: [place] (if you have MyChart) OR A paper copy in the mail If you have any lab test that is abnormal or we need to change your treatment, we will call you to review the results.   Testing/Procedures: NONE   Follow-Up: At [facility], you and your health needs are our priority.  As part of our continuing mission to provide you with exceptional heart care, we have created designated Provider Care Teams.  These Care Teams include your primary Cardiologist (physician) and Advanced Practice Providers (APPs -  Physician Assistants and Nurse  Practitioners) who all work together to provide you with the care you need, when you need  it.  We recommend signing up for the patient portal called "MyChart".  Sign up information is provided on this After Visit Summary.  MyChart is used to connect with patients for Virtual Visits (Telemedicine).  Patients are able to view lab/test results, encounter notes, upcoming appointments, etc.  Non-urgent messages can be sent to your provider as well.   To learn more about what you can do with MyChart, go to [email or web address].    Your next appointment:   1 year(s)  The format for your next appointment:   In Person  Provider:   Ronn Melena, or Yen Wandell {     Important Information About Sugar         Signed, Oscar Moores, MD  04/25/2022 6:18 PM    [place]

## 2022-04-25 ENCOUNTER — Encounter: Payer: Self-pay | Admitting: Cardiovascular Disease

## 2022-04-25 ENCOUNTER — Ambulatory Visit: Payer: Medicare Other | Admitting: Cardiovascular Disease

## 2022-04-25 VITALS — BP 134/78 | HR 72 | Ht 67.0 in | Wt 212.4 lb

## 2022-04-25 DIAGNOSIS — I2510 Atherosclerotic heart disease of native coronary artery without angina pectoris: Secondary | ICD-10-CM | POA: Diagnosis not present

## 2022-04-25 DIAGNOSIS — I10 Essential (primary) hypertension: Secondary | ICD-10-CM

## 2022-04-25 DIAGNOSIS — Z952 Presence of prosthetic heart valve: Secondary | ICD-10-CM

## 2022-04-25 MED ORDER — NITROGLYCERIN 0.4 MG SL SUBL: 0.4000 mg | SUBLINGUAL_TABLET | SUBLINGUAL | 6 refills | Status: AC | PRN

## 2022-04-25 NOTE — Patient Instructions (Signed)
Medication Instructions:  Refilled Nitroglycerin *If you need a refill on your cardiac medications before your next appointment, please call your pharmacy*   Lab Work: NONE If you have labs (blood work) drawn today and your tests are completely normal, you will receive your results only by: [place] (if you have MyChart) OR A paper copy in the mail If you have any lab test that is abnormal or we need to change your treatment, we will call you to review the results.   Testing/Procedures: NONE   Follow-Up: At [facility], you and your health needs are our priority.  As part of our continuing mission to provide you with exceptional heart care, we have created designated Provider Care Teams.  These Care Teams include your primary Cardiologist (physician) and Advanced Practice Providers (APPs -  Physician Assistants and Nurse Practitioners) who all work together to provide you with the care you need, when you need it.  We recommend signing up for the patient portal called "MyChart".  Sign up information is provided on this After Visit Summary.  MyChart is used to connect with patients for Virtual Visits (Telemedicine).  Patients are able to view lab/test results, encounter notes, upcoming appointments, etc.  Non-urgent messages can be sent to your provider as well.   To learn more about what you can do with MyChart, go to [email or web address].    Your next appointment:   1 year(s)  The format for your next appointment:   In Person  Provider:   Ronn Melena, or [place] {     Important Information About Sugar

## 2022-05-06 ENCOUNTER — Other Ambulatory Visit: Payer: Self-pay | Admitting: Physician Assistant

## 2022-05-06 DIAGNOSIS — Z952 Presence of prosthetic heart valve: Secondary | ICD-10-CM

## 2022-06-12 NOTE — Progress Notes (Unsigned)
HEART AND [place]                                     Cardiology Office Note:    Date:  06/17/2022   ID:  [name], DOB 07-27-1951, MRN [id number]  PCP:  Lois Huxley, PA  [facility] Cardiologist:  Mertie Moores, MD / Dr. Lenna Sciara, MD & Dr. Gilford Raid, MD (TAVR)  Referring MD: Lois Huxley, PA   1 year s/p TAVR  History of Present Illness:    Oscar Bailey is a 71 y.o. male with a hx of arthritis, CAD w/PCI to diagonal in 2011, HTN, HLD, hypothyroidism, prostate cancer and severe aortic stenosis s/p TAVR (07/16/21) who presents to clinic for follow up.    He is followed by Dr. Acie Fredrickson for his cardiology care and was seen back follow-up in 02/2021 and was reporting exertional fatigue and chest discomfort. A follow-up echo on 02/28/2021 showed an EF of 65 to 70%. The mean gradient across aortic valve had increased to 35 mmHg with a peak gradient of 63 mmHg. Aortic valve area was 0.87 cm with a dimensionless index of 0.27. Stroke-volume index was 33. He underwent cardiac catheterization on 04/01/2021 showing a patent diagonal stent and otherwise mild nonobstructive coronary disease.    The patient was evaluated by the multidisciplinary valve team and underwent successful TAVR with a 43m Edwards Sapien 3 Ultra via the TF approach on 07/16/21. Post operative echo showed EF 60% with normally functioning TAVR valve with no significant PVL with a mean gradient at 167mg, peak 35 mmHg, DVI 0.69 and AVA 3.4 cm2. He was continued on ASA '81mg'$  QD. He has done quite well in follow up with a big symptomatic improvement. 1 month echo showed EF 60%, normally functioning TAVR with a mean gradient of 13 mm hg and trivial PVL.   Today the patient presents to clinic for follow up. No CP or SOB. No LE edema, orthopnea or PND. No dizziness or syncope. No blood in stool or urine. No palpitations. Mostly limited by arthritis.    Past Medical History:   Diagnosis Date   Aortic stenosis    Echo 10/18: EF 55-60, normal wall motion, grade 1 diastolic dysfunction, moderate aortic stenosis (mean 20, peak 35) // Echocardiogram 5/22: EF 65-70, mod to severe AS (mean 35 mmHg, Vmax 396 cm/s, DI 0.25), trivial AI, normal RVSF, trivial MR, mild dilation of ascending aorta (36 mm)   Arthritis    FINGER S    CAD (coronary artery disease) 2011   [place] [facility] [facility] LAD 30, proximal D1 80, RCA 50 >> PCI: 2.25 x 20 mm taxus DES to the D1   Heart murmur    History of echocardiogram    [facility]   HLD (hyperlipidemia)    HTN (hypertension)    Hypothyroidism    hyperthyroidism >> s/p RAI treatment 2002   [place] [facility] [place] [facility]   [facility]   STENT PLACED     Past Surgical History:  Procedure Laterality Date   CARDIAC CATHETERIZATION  2011   [place] L [place] [facility] 09/24[place]   Procedure: CYSTOSCOPY;  Surgeon: Raynelle Bring, MD;  Location: [facility];  Service: Urology;  Laterality: N/A;   HERNIA REPAIR  2010   inguinal   JOINT REPLACEMENT Right 2013   Hip   LUMBAR LAMINECTOMY Right 2015   PROSTATE BIOPSY  04/2017   RADIOACTIVE SEED IMPLANT N/A 09/24/2017   Procedure: RADIOACTIVE SEED IMPLANT/BRACHYTHERAPY IMPLANT;  Surgeon: Raynelle Bring, MD;  Location: [facility];  Service: Urology;  Laterality: N/A;   RIGHT/LEFT HEART CATH AND CORONARY ANGIOGRAPHY N/A 04/01/2021   Procedure: RIGHT/LEFT HEART CATH AND CORONARY ANGIOGRAPHY;  Surgeon: Burnell Blanks, MD;  Location: [place] [facility];  Service: Cardiovascular;  Laterality: N/A;   SPACE OAR INSTILLATION N/A 09/24/2017   Procedure: SPACE OAR INSTILLATION;  Surgeon: Raynelle Bring, MD;  Location[facility];  Service: Urology;  Laterality: N/A;   TEE WITHOUT CARDIOVERSION N/A  07/16/2021   Procedure: TRANSESOPHAGEAL ECHOCARDIOGRAM (TEE);  Surgeon: Burnell Blanks, MD;  Location: [place];  Service: Open Heart Surgery;  Laterality: N/A;   TOTAL HIP ARTHROPLASTY Right 2013   TRANSCATHETER AORTIC VALVE REPLACEMENT, TRANSFEMORAL N/A 07/16/2021   Procedure: TRANSCATHETER AORTIC VALVE REPLACEMENT, TRANSFEMORAL USING A 26 EDWARDS AORTIC VALVE;  Surgeon: Burnell Blanks, MD;  Location: [place];  Service: Open Heart Surgery;  Laterality: N/A;    Current Medications: Current Meds  Medication Sig   amoxicillin (AMOXIL) 500 MG tablet Take FOUR tablets (2000 mg) by mouth one hour before any dental procedures   aspirin EC 81 MG tablet Take 81 mg by mouth daily.   ezetimibe (ZETIA) 10 MG tablet Take 10 mg by mouth daily.   levothyroxine (SYNTHROID) 125 MCG tablet Take 125 mcg by mouth daily before breakfast.   lisinopril (PRINIVIL,ZESTRIL) 5 MG tablet Take 5 mg by mouth daily.   metoprolol tartrate (LOPRESSOR) 25 MG tablet Take 12.5 mg by mouth 2 (two) times daily.    nitroGLYCERIN (NITROSTAT) 0.4 MG SL tablet Place 1 tablet (0.4 mg total) under the tongue every 5 (five) minutes as needed for chest pain.   Polyethylene Glycol 400 (BLINK TEARS) 0.25 % SOLN Place 1 drop into both eyes 2 (two) times daily as needed (dry eyes).   rosuvastatin (CRESTOR) 40 MG tablet Take 40 mg by mouth daily.     Allergies:   Patient has no known allergies.   Social History   Socioeconomic History   Marital status: Single    Spouse name: Not on file   Number of children: 1   Years of education: Not on file   Highest education level: Not on file  Occupational History   Occupation: Air cabin crew for [employer]  Tobacco Use   Smoking status: Every Day    Packs/day: 0.75    Years: 55.00    Total pack years: 41.25    Types: Cigarettes   Smokeless tobacco: Never  Vaping Use   Vaping Use: Never used  Substance and Sexual Activity   Alcohol use: Yes     Alcohol/week: 10.0 standard drinks of alcohol    Types: 10 Cans of beer per week    Comment: 2-6 beers per day   Drug use: No   Sexual activity: Yes  Other Topics Concern   Not on file  Social History Narrative   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   Moved to [place] from [place], [place] in 6.2018   Engaged   1 son -20 yo (Master's Degree candidate in Engineer, mining at [employer])   Social Determinants of Engineer, drilling  Resource Strain: Not on file  Food Insecurity: Not on file  Transportation Needs: Not on file  Physical Activity: Not on file  Stress: Not on file  Social Connections: Not on file     Family History: The patient's family history includes Alzheimer's disease in his mother; CAD in his father; Cancer in his brother; Dementia in his father; Heart attack in his paternal uncle.  ROS:   Please see the history of present illness.    All other systems reviewed and are negative.  EKGs/Labs/Other Studies Reviewed:    The following studies were reviewed today:    TAVR OPERATIVE NOTE     Date of Procedure:                07/16/2021   Preoperative Diagnosis:      Severe Aortic Stenosis    Postoperative Diagnosis:    Same    Procedure:        Transcatheter Aortic Valve Replacement - Percutaneous right Transfemoral Approach             Edwards Sapien 3 Ultra THV (size 26 mm, model # 9750TFX, serial # [id number])              Co-Surgeons:                        Gaye Pollack, MD and Lauree Chandler, MD, and Lenna Sciara, MD     Anesthesiologist:                  Suzette Battiest, MD   Echocardiographer:              Jenkins Rouge, MD   Pre-operative Echo Findings: Severe aortic stenosis Normal left ventricular systolic function   Post-operative Echo Findings: No paravalvular leak Normal left ventricular systolic function   ____________  Echocardiogram 07/17/21:   1. Left ventricular ejection fraction, by estimation, is 60 to 65%. The  left  ventricle has normal function. The left ventricle has no regional  wall motion abnormalities. There is mild left ventricular hypertrophy.  Left ventricular diastolic parameters  were normal.   2. Right ventricular systolic function is normal. The right ventricular  size is normal.   3. Left atrial size was moderately dilated.   4. The mitral valve is normal in structure. No evidence of mitral valve  regurgitation. No evidence of mitral stenosis.   5. Post TAVR with 26 mm Sapien 3 valve no significant PVL mean gradient  16 peak 35 mmHg DVI 0.69 and AVA 3.4 cm2 . The aortic valve has been  repaired/replaced. Aortic valve regurgitation is not visualized. No aortic  stenosis is present. There is a 26 mm   Sapien prosthetic (TAVR) valve present in the aortic position. Procedure  Date: 07/16/21.   6. The inferior vena cava is normal in size with greater than 50%  respiratory variability, suggesting right atrial pressure of 3 mmHg.   _________________________  Echo 08/14/21 IMPRESSIONS  1. Left ventricular ejection fraction, by estimation, is 60 to 65%. The left ventricle has normal function. The left ventricle has no regional wall motion abnormalities.  2. Right ventricular systolic function is normal. The right ventricular size is normal.  3. The mitral valve is normal in structure. No evidence of mitral valve regurgitation. No evidence of mitral stenosis.  4. Trivial perivalvular leak . The aortic valve is normal in structure. Aortic valve regurgitation is trivial. No aortic stenosis is present. There is  a 26 mm Sapien prosthetic (TAVR) valve present in the aortic position. Procedure Date: 07/16/2021.  Echo findings are consistent with normal structure and function of the aortic valve prosthesis. Aortic valve area, by VTI measures 1.45 cm. Aortic valve mean gradient measures 13.0 mmHg. Aortic valve Vmax measures 2.56 m/s.  5. The inferior vena cava is normal in size with greater than 50%  respiratory variability, suggesting right atrial pressure of 3 mmHg.  ___________________________  Echo 06/13/22 IMPRESSIONS   1. Global longitudinal strain is -21.6% (normal). Left ventricular  ejection fraction, by estimation, is 65 to 70%. The left ventricle has  normal function. The left ventricle has no regional wall motion  abnormalities. Left ventricular diastolic  parameters were normal.   2. Right ventricular systolic function is normal. The right ventricular  size is normal.   3. The mitral valve is normal in structure. Trivial mitral valve  regurgitation.   4. S/p TAVR (26 mm Sapien valve, procedure date 07/16/21). Peak and mean  gradients through the valve are 17 and 9 mm Hg respectively. Trivial  perivalvular insufficiency. Compared to echo from Nov 2022, mean gradient  is lower. (13 to 9 mm Hg).   5. The inferior vena cava is dilated in size with >50% respiratory  variability, suggesting right atrial pressure of 8 mmHg.    EKG:  EKG is NOT ordered today.    Recent Labs: 07/12/2021: ALT 34 07/17/2021: BUN 14; Creatinine, Ser 1.03; Hemoglobin 13.1; Platelets 142; Potassium 3.7; Sodium 135  Recent Lipid Panel No results found for: "CHOL", "TRIG", "HDL", "CHOLHDL", "VLDL", "LDLCALC", "LDLDIRECT"  Physical Exam:    VS:  BP 122/64   Pulse 70   Ht '5\' 7"'$  (1.702 m)   Wt 207 lb 12.8 oz (94.3 kg)   SpO2 96%   BMI 32.55 kg/m     Wt Readings from Last 3 Encounters:  06/13/22 207 lb 12.8 oz (94.3 kg)  04/25/22 212 lb 6.4 oz (96.3 kg)  08/14/21 205 lb (93 kg)    General: Well developed, well nourished, NAD Lungs:Clear to ausculation bilaterally. Breathing is unlabored. Cardiovascular: RRR with S1 S2. No murmurs Extremities: No edema. Neuro: Alert and oriented. No focal deficits. No facial asymmetry. MAE spontaneously. Psych: Responds to questions appropriately with normal affect.    ASSESSMENT/PLAN:    Severe AS s/p TAVR: echo today shows EF 65%, normally functioning  TAVR with a mean gradient of 9 mm hg and trivial PVL. He has NYHA class I symptoms. He has amoxicillin for SBE prophylaxis. Continue on aspirin 81 mg daily. Continue regular follow up with Dr. Acie Fredrickson.  CAD s/p PCI to D1 2011: pre TAVR cardiac catheterization on 04/01/2021 showing a patent diagonal stent and otherwise mild nonobstructive coronary disease.. Continue ASA, beta-blocker, statin.   HLD: continue statin therapy   HTN: BP well controlled today. No changes made  Medication Adjustments/Labs and Tests Ordered: Current medicines are reviewed at length with the patient today.  Concerns regarding medicines are outlined above.  No orders of the defined types were placed in this encounter.    No orders of the defined types were placed in this encounter.     Patient Instructions  Medication Instructions:  Your physician recommends that you continue on your current medications as directed. Please refer to the Current Medication list given to you today.  *If you need a refill on your cardiac medications before your next appointment, please call your pharmacy*   Lab Work: None ordered   If you  have labs (blood work) drawn today and your tests are completely normal, you will receive your results only by: [place] (if you have MyChart) OR A paper copy in the mail If you have any lab test that is abnormal or we need to change your treatment, we will call you to review the results.   Testing/Procedures: None ordered    Follow-Up: At [facility], you and your health needs are our priority.  As part of our continuing mission to provide you with exceptional heart care, we have created designated Provider Care Teams.  These Care Teams include your primary Cardiologist (physician) and Advanced Practice Providers (APPs -  Physician Assistants and Nurse Practitioners) who all work together to provide you with the care you need, when you need it.  We recommend signing up  for the patient portal called "MyChart".  Sign up information is provided on this After Visit Summary.  MyChart is used to connect with patients for Virtual Visits (Telemedicine).  Patients are able to view lab/test results, encounter notes, upcoming appointments, etc.  Non-urgent messages can be sent to your provider as well.   To learn more about what you can do with MyChart, go to [email or web address].    Your next appointment:   9 month(s)  The format for your next appointment:   In Person  Provider:   Mertie Moores, MD     Other Instructions   Important Information About Sugar         Signed, Angelena Form, PA-C  06/17/2022 8:46 AM    [place]

## 2022-06-13 ENCOUNTER — Ambulatory Visit (HOSPITAL_COMMUNITY): Payer: Medicare Other | Attending: Physician Assistant

## 2022-06-13 ENCOUNTER — Ambulatory Visit: Payer: Medicare Other | Admitting: Physician Assistant

## 2022-06-13 VITALS — BP 122/64 | HR 70 | Ht 67.0 in | Wt 207.8 lb

## 2022-06-13 DIAGNOSIS — I2510 Atherosclerotic heart disease of native coronary artery without angina pectoris: Secondary | ICD-10-CM | POA: Insufficient documentation

## 2022-06-13 DIAGNOSIS — E782 Mixed hyperlipidemia: Secondary | ICD-10-CM | POA: Insufficient documentation

## 2022-06-13 DIAGNOSIS — Z952 Presence of prosthetic heart valve: Secondary | ICD-10-CM | POA: Diagnosis not present

## 2022-06-13 DIAGNOSIS — I10 Essential (primary) hypertension: Secondary | ICD-10-CM | POA: Insufficient documentation

## 2022-06-13 LAB — ECHOCARDIOGRAM COMPLETE
AV Mean grad: 10.6 mmHg
AV Peak grad: 20.1 mmHg
Ao pk vel: 2.24 m/s
Area-P 1/2: 4.39 cm2
S' Lateral: 2.8 cm

## 2022-06-13 NOTE — Patient Instructions (Signed)
Medication Instructions:  Your physician recommends that you continue on your current medications as directed. Please refer to the Current Medication list given to you today.  *If you need a refill on your cardiac medications before your next appointment, please call your pharmacy*   Lab Work: None ordered   If you have labs (blood work) drawn today and your tests are completely normal, you will receive your results only by: [place] (if you have MyChart) OR A paper copy in the mail If you have any lab test that is abnormal or we need to change your treatment, we will call you to review the results.   Testing/Procedures: None ordered    Follow-Up: At [facility], you and your health needs are our priority.  As part of our continuing mission to provide you with exceptional heart care, we have created designated Provider Care Teams.  These Care Teams include your primary Cardiologist (physician) and Advanced Practice Providers (APPs -  Physician Assistants and Nurse Practitioners) who all work together to provide you with the care you need, when you need it.  We recommend signing up for the patient portal called "MyChart".  Sign up information is provided on this After Visit Summary.  MyChart is used to connect with patients for Virtual Visits (Telemedicine).  Patients are able to view lab/test results, encounter notes, upcoming appointments, etc.  Non-urgent messages can be sent to your provider as well.   To learn more about what you can do with MyChart, go to [email or web address].    Your next appointment:   9 month(s)  The format for your next appointment:   In Person  Provider:   Mertie Moores, MD     Other Instructions   Important Information About Sugar

## 2022-09-15 DIAGNOSIS — M79645 Pain in left finger(s): Secondary | ICD-10-CM | POA: Diagnosis not present

## 2022-09-15 DIAGNOSIS — M79644 Pain in right finger(s): Secondary | ICD-10-CM | POA: Diagnosis not present

## 2022-09-15 DIAGNOSIS — M65331 Trigger finger, right middle finger: Secondary | ICD-10-CM | POA: Diagnosis not present

## 2022-09-18 ENCOUNTER — Other Ambulatory Visit: Payer: Self-pay | Admitting: Family Medicine

## 2022-09-18 DIAGNOSIS — Z23 Encounter for immunization: Secondary | ICD-10-CM | POA: Diagnosis not present

## 2022-09-18 DIAGNOSIS — I2510 Atherosclerotic heart disease of native coronary artery without angina pectoris: Secondary | ICD-10-CM | POA: Diagnosis not present

## 2022-09-18 DIAGNOSIS — E7800 Pure hypercholesterolemia, unspecified: Secondary | ICD-10-CM | POA: Diagnosis not present

## 2022-09-18 DIAGNOSIS — Z1211 Encounter for screening for malignant neoplasm of colon: Secondary | ICD-10-CM | POA: Diagnosis not present

## 2022-09-18 DIAGNOSIS — Z720 Tobacco use: Secondary | ICD-10-CM | POA: Diagnosis not present

## 2022-09-18 DIAGNOSIS — I10 Essential (primary) hypertension: Secondary | ICD-10-CM | POA: Diagnosis not present

## 2022-09-18 DIAGNOSIS — Z0000 Encounter for general adult medical examination without abnormal findings: Secondary | ICD-10-CM | POA: Diagnosis not present

## 2022-09-18 DIAGNOSIS — E039 Hypothyroidism, unspecified: Secondary | ICD-10-CM | POA: Diagnosis not present

## 2022-09-24 ENCOUNTER — Ambulatory Visit
Admission: RE | Admit: 2022-09-24 | Discharge: 2022-09-24 | Disposition: A | Discharge status: Home / Self Care | Payer: Medicare Other | Source: Ambulatory Visit | Attending: Family Medicine | Admitting: Family Medicine

## 2022-09-24 DIAGNOSIS — Z720 Tobacco use: Secondary | ICD-10-CM

## 2022-09-24 DIAGNOSIS — F17210 Nicotine dependence, cigarettes, uncomplicated: Secondary | ICD-10-CM | POA: Diagnosis not present

## 2023-01-01 ENCOUNTER — Other Ambulatory Visit: Payer: Self-pay | Admitting: *Deleted

## 2023-01-01 ENCOUNTER — Telehealth: Payer: Self-pay | Admitting: *Deleted

## 2023-01-01 DIAGNOSIS — M5451 Vertebrogenic low back pain: Secondary | ICD-10-CM | POA: Diagnosis not present

## 2023-01-01 DIAGNOSIS — Z87891 Personal history of nicotine dependence: Secondary | ICD-10-CM

## 2023-01-01 DIAGNOSIS — M25551 Pain in right hip: Secondary | ICD-10-CM | POA: Diagnosis not present

## 2023-01-01 DIAGNOSIS — R911 Solitary pulmonary nodule: Secondary | ICD-10-CM

## 2023-01-01 NOTE — Telephone Encounter (Signed)
Received faxed referral from [facility] on 12/25/22 for lung cancer screening referral. Pt had lung cancer screening CT on 09/24/22 that was ordered by Erenest Blank that read as a Lung Rads 4A with need for CT nodule follow up in 3 months.  Spoke with pt and offered appt for shared decision making visit for 01/02/23 but pt could not make this appt work and asked for appt next week. Scheduled shared decision making visit for 01/09/23 2:00 with nodule f/u CT on 01/14/23 9:40. Pt verbalized understanding and had no further questions.

## 2023-01-09 ENCOUNTER — Ambulatory Visit (INDEPENDENT_AMBULATORY_CARE_PROVIDER_SITE_OTHER): Payer: Medicare Other | Admitting: Primary Care

## 2023-01-09 DIAGNOSIS — F17210 Nicotine dependence, cigarettes, uncomplicated: Secondary | ICD-10-CM

## 2023-01-09 DIAGNOSIS — F17200 Nicotine dependence, unspecified, uncomplicated: Secondary | ICD-10-CM | POA: Diagnosis not present

## 2023-01-09 NOTE — Patient Instructions (Signed)
Thank you for participating in the [facility]. It was our pleasure to meet you today. We will call you with the results of your scan within the next few days. Your scan will be assigned a Lung RADS category score by the physicians reading the scans.  This Lung RADS score determines follow up scanning.  See below for description of categories, and follow up screening recommendations. We will be in touch to schedule your follow up screening annually or based on recommendations of our providers. We will fax a copy of your scan results to your Primary Care Physician, or the physician who referred you to the program, to ensure they have the results. Please call the office if you have any questions or concerns regarding your scanning experience or results.  Our office number is [phone]. Please speak with Denise Phelps, RN. , or  Denise Buckner RN, They are  our Lung Cancer Screening RN.'s If They are unavailable when you call, Please leave a message on the voice mail. We will return your call at our earliest convenience.This voice mail is monitored several times a day.  Remember, if your scan is normal, we will scan you annually as long as you continue to meet the criteria for the program. (Age 50-80, Current smoker or smoker who has quit within the last 15 years). If you are a smoker, remember, quitting is the single most powerful action that you can take to decrease your risk of lung cancer and other pulmonary, breathing related problems. We know quitting is hard, and we are here to help.  Please let us know if there is anything we can do to help you meet your goal of quitting. If you are a former smoker, congratulations. We are proud of you! Remain smoke free! Remember you can refer friends or family members through the number above.  We will screen them to make sure they meet criteria for the program. Thank you for helping us take better care of you by  participating in Lung Screening.  You can receive free nicotine replacement therapy ( patches, gum or mints) by calling 1-800-QUIT NOW. Please call so we can get you on the path to becoming  a non-smoker. I know it is hard, but you can do this!  Lung RADS Categories:  Lung RADS 1: no nodules or definitely non-concerning nodules.  Recommendation is for a repeat annual scan in 12 months.  Lung RADS 2:  nodules that are non-concerning in appearance and behavior with a very low likelihood of becoming an active cancer. Recommendation is for a repeat annual scan in 12 months.  Lung RADS 3: nodules that are probably non-concerning , includes nodules with a low likelihood of becoming an active cancer.  Recommendation is for a 6-month repeat screening scan. Often noted after an upper respiratory illness. We will be in touch to make sure you have no questions, and to schedule your 6-month scan.  Lung RADS 4 A: nodules with concerning findings, recommendation is most often for a follow up scan in 3 months or additional testing based on our provider's assessment of the scan. We will be in touch to make sure you have no questions and to schedule the recommended 3 month follow up scan.  Lung RADS 4 B:  indicates findings that are concerning. We will be in touch with you to schedule additional diagnostic testing based on our provider's  assessment of the scan.  Other options for assistance in smoking cessation (   As covered by your insurance benefits)  Hypnosis for smoking cessation  [employer] [phone]  Acupuncture for smoking cessation  [facility] [phone]   

## 2023-01-09 NOTE — Progress Notes (Signed)
Virtual Visit via Telephone Note  I connected with Oscar Bailey on 01/09/23 at  2:00 PM EDT by telephone and verified that I am speaking with the correct person using two identifiers.  Location: Patient: Home Provider: Office   I discussed the limitations, risks, security and privacy concerns of performing an evaluation and management service by telephone and the availability of in person appointments. I also discussed with the patient that there may be a patient responsible charge related to this service. The patient expressed understanding and agreed to proceed.   Shared Decision Making Visit Lung Cancer Screening Program [phone])   Eligibility: Age 72 y.o. Pack Years Smoking History Calculation 52 (# packs/per year x # years smoked) Recent History of coughing up blood  no Unexplained weight loss? no ( >Than 15 pounds within the last 6 months ) Prior History Lung / other cancer no (Diagnosis within the last 5 years already requiring surveillance chest CT Scans). Smoking Status Current Smoker Former Smokers: Years since quit: NA  Quit Date: NA  Visit Components: Discussion included one or more decision making aids. yes Discussion included risk/benefits of screening. yes Discussion included potential follow up diagnostic testing for abnormal scans. yes Discussion included meaning and risk of over diagnosis. yes Discussion included meaning and risk of False Positives. yes Discussion included meaning of total radiation exposure. yes  Counseling Included: Importance of adherence to annual lung cancer LDCT screening. yes Impact of comorbidities on ability to participate in the program. yes Ability and willingness to under diagnostic treatment. yes  Smoking Cessation Counseling: Current Smokers:  Discussed importance of smoking cessation. yes Information about tobacco cessation classes and interventions provided to patient. yes Patient provided with "ticket" for LDCT Scan.  NA Symptomatic Patient. no  Counseling(Intermediate counseling: > three minutes) 99406 Diagnosis Code: Tobacco Use Z72.0 Asymptomatic Patient yes  Counseling (Intermediate counseling: > three minutes counseling) [id number] Former Smokers:  Discussed the importance of maintaining cigarette abstinence. yes Diagnosis Code: Personal History of Nicotine Dependence. [id number] Information about tobacco cessation classes and interventions provided to patient. Yes Patient provided with "ticket" for LDCT Scan. NA Written Order for Lung Cancer Screening with LDCT placed in Epic. Yes (CT Chest Lung Cancer Screening Low Dose W/O CM) [id number] Z12.2-Screening of respiratory organs Z87.891-Personal history of nicotine dependence  I have spent 25 minutes of face to face/ virtual visit   time with [name] discussing the risks and benefits of lung cancer screening. We viewed / discussed a power point together that explained in detail the above noted topics. We paused at intervals to allow for questions to be asked and answered to ensure understanding.We discussed that the single most powerful action that he can take to decrease his risk of developing lung cancer is to quit smoking. We discussed whether or not he is ready to commit to setting a quit date. We discussed options for tools to aid in quitting smoking including nicotine replacement therapy, non-nicotine medications, support groups, Quit Smart classes, and behavior modification. We discussed that often times setting smaller, more achievable goals, such as eliminating 1 cigarette a day for a week and then 2 cigarettes a day for a week can be helpful in slowly decreasing the number of cigarettes smoked. This allows for a sense of accomplishment as well as providing a clinical benefit. I provided him with smoking cessation  information  with contact information for community resources, classes, free nicotine replacement therapy, and access to mobile apps, text  messaging, and on-line smoking  cessation help. I have also provided him the office contact information in the event he needs to contact me, or the screening staff. We discussed the time and location of the scan, and that either Doroteo Glassman RN, Joella Prince, RN  or I will call / send a letter with the results within 24-72 hours of receiving them. The patient verbalized understanding of all of  the above and had no further questions upon leaving the office. They have my contact information in the event they have any further questions.  I spent 3-5 minutes counseling on smoking cessation and the health risks of continued tobacco abuse.  I explained to the patient that there has been a high incidence of coronary artery disease noted on these exams. I explained that this is a non-gated exam therefore degree or severity cannot be determined. This patient is on statin therapy. I have asked the patient to follow-up with their PCP regarding any incidental finding of coronary artery disease and management with diet or medication as their PCP  feels is clinically indicated. The patient verbalized understanding of the above and had no further questions upon completion of the visit.   Martyn Ehrich, NP

## 2023-01-14 ENCOUNTER — Ambulatory Visit
Admission: RE | Admit: 2023-01-14 | Discharge: 2023-01-14 | Disposition: A | Discharge status: Home / Self Care | Payer: Medicare Other | Source: Ambulatory Visit | Attending: Acute Care | Admitting: Acute Care

## 2023-01-14 DIAGNOSIS — R911 Solitary pulmonary nodule: Secondary | ICD-10-CM | POA: Diagnosis not present

## 2023-01-14 DIAGNOSIS — J439 Emphysema, unspecified: Secondary | ICD-10-CM | POA: Diagnosis not present

## 2023-01-14 DIAGNOSIS — I700 Atherosclerosis of aorta: Secondary | ICD-10-CM | POA: Diagnosis not present

## 2023-01-14 DIAGNOSIS — Z87891 Personal history of nicotine dependence: Secondary | ICD-10-CM

## 2023-01-15 ENCOUNTER — Other Ambulatory Visit: Payer: Self-pay

## 2023-01-15 DIAGNOSIS — F17210 Nicotine dependence, cigarettes, uncomplicated: Secondary | ICD-10-CM

## 2023-01-15 DIAGNOSIS — Z87891 Personal history of nicotine dependence: Secondary | ICD-10-CM

## 2023-02-02 DIAGNOSIS — Z96649 Presence of unspecified artificial hip joint: Secondary | ICD-10-CM | POA: Diagnosis not present

## 2023-02-02 DIAGNOSIS — Z96641 Presence of right artificial hip joint: Secondary | ICD-10-CM | POA: Diagnosis not present

## 2023-03-04 DIAGNOSIS — Z86010 Personal history of colonic polyps: Secondary | ICD-10-CM | POA: Diagnosis not present

## 2023-03-05 ENCOUNTER — Telehealth: Payer: Self-pay | Admitting: *Deleted

## 2023-03-05 NOTE — Telephone Encounter (Signed)
   Name: Oscar Bailey  DOB: 1951-04-12  MRN: [id number]  Primary Cardiologist: Kristeen Miss, MD   Preoperative team, please contact this patient and set up a phone call appointment for further preoperative risk assessment. Please obtain consent and complete medication review. Thank you for your help.  I confirm that guidance regarding antiplatelet and oral anticoagulation therapy has been completed and, if necessary, noted below.  Regarding ASA therapy, we recommend continuation of ASA throughout the perioperative period.  However, if the surgeon feels that cessation of ASA is required in the perioperative period, it may be stopped 5-7 days prior to surgery with a plan to resume it as soon as felt to be feasible from a surgical standpoint in the post-operative period.    Napoleon Form, Leodis Rains, NP 03/05/2023, 11:21 AM [facility]

## 2023-03-05 NOTE — Telephone Encounter (Signed)
Pt has been scheduled for tele pre op appt 03/16/23 @ 10:20. Med rec and consent are done.

## 2023-03-05 NOTE — Telephone Encounter (Signed)
Pt has been scheduled for tele pre op appt 03/16/23 @ 10:20. Med rec and consent are done.      Patient Consent for Virtual Visit        Oscar Bailey has provided verbal consent on 03/05/2023 for a virtual visit (video or telephone).   CONSENT FOR VIRTUAL VISIT FOR:  Oscar Bailey  By participating in this virtual visit I agree to the following:  I hereby voluntarily request, consent and authorize [facility] and its employed or contracted physicians, physician assistants, nurse practitioners or other licensed health care professionals (the Practitioner), to provide me with telemedicine health care services (the "Services") as deemed necessary by the treating Practitioner. I acknowledge and consent to receive the Services by the Practitioner via telemedicine. I understand that the telemedicine visit will involve communicating with the Practitioner through live audiovisual communication technology and the disclosure of certain medical information by electronic transmission. I acknowledge that I have been given the opportunity to request an in-person assessment or other available alternative prior to the telemedicine visit and am voluntarily participating in the telemedicine visit.  I understand that I have the right to withhold or withdraw my consent to the use of telemedicine in the course of my care at any time, without affecting my right to future care or treatment, and that the Practitioner or I may terminate the telemedicine visit at any time. I understand that I have the right to inspect all information obtained and/or recorded in the course of the telemedicine visit and may receive copies of available information for a reasonable fee.  I understand that some of the potential risks of receiving the Services via telemedicine include:  Delay or interruption in medical evaluation due to technological equipment failure or disruption; Information transmitted may not be sufficient (e.g.  poor resolution of images) to allow for appropriate medical decision making by the Practitioner; and/or  In rare instances, security protocols could fail, causing a breach of personal health information.  Furthermore, I acknowledge that it is my responsibility to provide information about my medical history, conditions and care that is complete and accurate to the best of my ability. I acknowledge that Practitioner's advice, recommendations, and/or decision may be based on factors not within their control, such as incomplete or inaccurate data provided by me or distortions of diagnostic images or specimens that may result from electronic transmissions. I understand that the practice of medicine is not an exact science and that Practitioner makes no warranties or guarantees regarding treatment outcomes. I acknowledge that a copy of this consent can be made available to me via my patient portal [facility] MyChart), or I can request a printed copy by calling the office of [facility].    I understand that my insurance will be billed for this visit.   I have read or had this consent read to me. I understand the contents of this consent, which adequately explains the benefits and risks of the Services being provided via telemedicine.  I have been provided ample opportunity to ask questions regarding this consent and the Services and have had my questions answered to my satisfaction. I give my informed consent for the services to be provided through the use of telemedicine in my medical care

## 2023-03-05 NOTE — Telephone Encounter (Signed)
   Pre-operative Risk Assessment    Patient Name: Oscar Bailey  DOB: 17-Mar-1951 MRN: [id number]      Request for Surgical Clearance    Procedure:   COLONOSCOPY  Date of Surgery:  Clearance 03/25/23                                 Surgeon:  DR. Levora Angel Surgeon's Group or Practice Name:  [facility] Phone number:  [phone] Fax number:  [phone]   Type of Clearance Requested:   - Medical ; NO MEDICATIONS LISTED AS NEEDED TO BE HELD   Type of Anesthesia:  Not Indicated (PROPOFOL?)   Additional requests/questions:    Oscar Bailey   03/05/2023, 11:13 AM

## 2023-03-16 ENCOUNTER — Ambulatory Visit: Payer: Medicare Other | Attending: Cardiology | Admitting: Student

## 2023-03-16 DIAGNOSIS — Z01810 Encounter for preprocedural cardiovascular examination: Secondary | ICD-10-CM | POA: Diagnosis not present

## 2023-03-16 NOTE — Progress Notes (Signed)
Virtual Visit via Telephone Note   Because of Crisanto Therriault Spindel's co-morbid illnesses, he is at least at moderate risk for complications without adequate follow up.  This format is felt to be most appropriate for this patient at this time.  The patient did not have access to video technology/had technical difficulties with video requiring transitioning to audio format only (telephone).  All issues noted in this document were discussed and addressed.  No physical exam could be performed with this format.  Please refer to the patient's chart for his consent to telehealth for [facility].  Evaluation Performed:  Preoperative cardiovascular risk assessment _____________   Date:  03/16/2023   Patient ID:  Oscar Bailey, DOB 1951-08-05, MRN [id number] Patient Location:  Home Provider location:   Office  Primary Care Provider:  Wilfrid Lund, [place] Primary Cardiologist:  Kristeen Miss, MD  Chief Complaint / Patient Profile   72 y.o. y/o male with a h/o CAD s/p PCI to diagonal 2011, aortic stenosis s/p TAVR October 2022, hypertension, hyperlipidemia, hypothyroidism, prostate cancer who is pending colonoscopy by Dr. Levora Angel and presents today for telephonic preoperative cardiovascular risk assessment.  History of Present Illness    FLORA SWARTOUT is a 72 y.o. male who presents via audio/video conferencing for a telehealth visit today.  Pt was last seen in cardiology clinic on 06/13/2022 by Carlean Jews, PA-C.  At that time ZEVON YOUMANS was doing well from a cardiac standpoint.  The patient is now pending procedure as outlined above. Since his last visit, he is doing well. Patient denies shortness of breath or dyspnea on exertion. No chest pain, pressure, or tightness. Denies lower extremity edema, orthopnea, or PND. No palpitations. He stays active going camping and walking 1/2 mils three days a week. He also does yard work and other house chores without difficulty.   Past Medical  History    Past Medical History:  Diagnosis Date   Aortic stenosis    Echo 10/18: EF 55-60, normal wall motion, grade 1 diastolic dysfunction, moderate aortic stenosis (mean 20, peak 35) // Echocardiogram 5/22: EF 65-70, mod to severe AS (mean 35 mmHg, Vmax 396 cm/s, DI 0.25), trivial AI, normal RVSF, trivial MR, mild dilation of ascending aorta (36 mm)   Arthritis    FINGER S    CAD (coronary artery disease) 2011   Tx with DES [facility] [place], [place]); Dr. Lupe Carney in [place], [place] // [facility] 5/11 [facility]): Mid LAD 30, proximal D1 80, RCA 50 >> PCI: 2.25 x 20 mm taxus DES to the D1   Heart murmur    History of echocardiogram    Echo 5/11 [facility]): EF 55   HLD (hyperlipidemia)    HTN (hypertension)    Hypothyroidism    hyperthyroidism >> s/p RAI treatment 2002   Malignant neoplasm of prostate (HCC) 06/01/2017   Myocardial infarction [facility]) 2011   STENT PLACED    Past Surgical History:  Procedure Laterality Date   CARDIAC CATHETERIZATION  2011   WITH STENT L DONE AT [facility] IN [place]    CYSTOSCOPY N/A 09/24/2017   Procedure: CYSTOSCOPY;  Surgeon: Heloise Purpura, MD;  Location: [facility] ORS;  Service: Urology;  Laterality: N/A;   HERNIA REPAIR  2010   inguinal   JOINT REPLACEMENT Right 2013   Hip   LUMBAR LAMINECTOMY Right 2015   PROSTATE BIOPSY  04/2017   RADIOACTIVE SEED IMPLANT N/A 09/24/2017   Procedure: RADIOACTIVE  SEED IMPLANT/BRACHYTHERAPY IMPLANT;  Surgeon: Heloise Purpura, MD;  Location: [facility];  Service: Urology;  Laterality: N/A;   RIGHT/LEFT HEART CATH AND CORONARY ANGIOGRAPHY N/A 04/01/2021   Procedure: RIGHT/LEFT HEART CATH AND CORONARY ANGIOGRAPHY;  Surgeon: Kathleene Hazel, MD;  Location: [facility] INVASIVE CV LAB;  Service: Cardiovascular;  Laterality: N/A;   SPACE OAR INSTILLATION N/A 09/24/2017   Procedure: SPACE OAR INSTILLATION;  Surgeon: Heloise Purpura, MD;  Location: [facility];  Service: Urology;  Laterality: N/A;    TEE WITHOUT CARDIOVERSION N/A 07/16/2021   Procedure: TRANSESOPHAGEAL ECHOCARDIOGRAM (TEE);  Surgeon: Kathleene Hazel, MD;  Location: [facility] OR;  Service: Open Heart Surgery;  Laterality: N/A;   TOTAL HIP ARTHROPLASTY Right 2013   TRANSCATHETER AORTIC VALVE REPLACEMENT, TRANSFEMORAL N/A 07/16/2021   Procedure: TRANSCATHETER AORTIC VALVE REPLACEMENT, TRANSFEMORAL USING A 26 EDWARDS AORTIC VALVE;  Surgeon: Kathleene Hazel, MD;  Location: [facility] OR;  Service: Open Heart Surgery;  Laterality: N/A;    Allergies  No Known Allergies  Home Medications    Prior to Admission medications   Medication Sig Start Date End Date Taking? Authorizing Provider  amoxicillin (AMOXIL) 500 MG tablet Take FOUR tablets (2000 mg) by mouth one hour before any dental procedures Patient not taking: Reported on 01/09/2023 07/25/21   Georgie Chard D, NP  aspirin EC 81 MG tablet Take 81 mg by mouth daily.    [provider]  ezetimibe (ZETIA) 10 MG tablet Take 10 mg by mouth daily. 12/09/20   [provider]  levothyroxine (SYNTHROID) 125 MCG tablet Take 125 mcg by mouth daily before breakfast.    [provider]  lisinopril (PRINIVIL,ZESTRIL) 5 MG tablet Take 5 mg by mouth daily.    [provider]  metoprolol tartrate (LOPRESSOR) 25 MG tablet Take 12.5 mg by mouth 2 (two) times daily.     [provider]  nitroGLYCERIN (NITROSTAT) 0.4 MG SL tablet Place 1 tablet (0.4 mg total) under the tongue every 5 (five) minutes as needed for chest pain. Patient not taking: Reported on 03/05/2023 04/25/22   Nahser, Deloris Ping, MD  Polyethylene Glycol 400 (BLINK TEARS) 0.25 % SOLN Place 1 drop into both eyes 2 (two) times daily as needed (dry eyes).    [provider]  rosuvastatin (CRESTOR) 40 MG tablet Take 40 mg by mouth daily. 12/14/19   [provider]    Physical Exam    Vital Signs:  Oscar Bailey does not have vital signs available for review today.  Given  telephonic nature of communication, physical exam is limited. AAOx3. NAD. Normal affect.  Speech and respirations are unlabored.  Accessory Clinical Findings    None  Assessment & Plan    Primary Cardiologist: Kristeen Miss, MD  Preoperative cardiovascular risk assessment.  Colonoscopy.  Chart reviewed as part of pre-operative protocol coverage. According to the RCRI, patient has a 0.9% risk of MACE. Patient reports activity equivalent to >4.0 METS (walking 1/2 mile 3 times a week, doing house chores).   Given past medical history and time since last visit, based on ACC/AHA guidelines, LADARRIUS LIPINSKI would be at acceptable risk for the planned procedure without further cardiovascular testing.   Patient was advised that if he develops new symptoms prior to surgery to contact our office to arrange a follow-up appointment.  he verbalized understanding.   I will route this recommendation to the requesting party via Epic fax function.  Please call with questions.  Time:   Today, I have spent  5 minutes with the patient with telehealth technology discussing medical history, symptoms, and management plan.     Carlos Levering, NP  03/16/2023, 8:24 AM

## 2023-03-20 DIAGNOSIS — Z720 Tobacco use: Secondary | ICD-10-CM | POA: Diagnosis not present

## 2023-03-20 DIAGNOSIS — I25119 Atherosclerotic heart disease of native coronary artery with unspecified angina pectoris: Secondary | ICD-10-CM | POA: Diagnosis not present

## 2023-03-20 DIAGNOSIS — J439 Emphysema, unspecified: Secondary | ICD-10-CM | POA: Diagnosis not present

## 2023-03-20 DIAGNOSIS — I700 Atherosclerosis of aorta: Secondary | ICD-10-CM | POA: Diagnosis not present

## 2023-03-20 DIAGNOSIS — I77819 Aortic ectasia, unspecified site: Secondary | ICD-10-CM | POA: Diagnosis not present

## 2023-03-20 DIAGNOSIS — I10 Essential (primary) hypertension: Secondary | ICD-10-CM | POA: Diagnosis not present

## 2023-03-20 DIAGNOSIS — E7800 Pure hypercholesterolemia, unspecified: Secondary | ICD-10-CM | POA: Diagnosis not present

## 2023-03-25 DIAGNOSIS — D123 Benign neoplasm of transverse colon: Secondary | ICD-10-CM | POA: Diagnosis not present

## 2023-03-25 DIAGNOSIS — D125 Benign neoplasm of sigmoid colon: Secondary | ICD-10-CM | POA: Diagnosis not present

## 2023-03-25 DIAGNOSIS — K5730 Diverticulosis of large intestine without perforation or abscess without bleeding: Secondary | ICD-10-CM | POA: Diagnosis not present

## 2023-03-25 DIAGNOSIS — D120 Benign neoplasm of cecum: Secondary | ICD-10-CM | POA: Diagnosis not present

## 2023-03-25 DIAGNOSIS — Z86010 Personal history of colonic polyps: Secondary | ICD-10-CM | POA: Diagnosis not present

## 2023-03-25 DIAGNOSIS — Z09 Encounter for follow-up examination after completed treatment for conditions other than malignant neoplasm: Secondary | ICD-10-CM | POA: Diagnosis not present

## 2023-03-27 DIAGNOSIS — D123 Benign neoplasm of transverse colon: Secondary | ICD-10-CM | POA: Diagnosis not present

## 2023-03-27 DIAGNOSIS — D120 Benign neoplasm of cecum: Secondary | ICD-10-CM | POA: Diagnosis not present

## 2023-03-27 DIAGNOSIS — D125 Benign neoplasm of sigmoid colon: Secondary | ICD-10-CM | POA: Diagnosis not present

## 2023-04-05 IMAGING — CT CT HEART MORP W/ CTA COR W/ SCORE W/ CA W/CM &/OR W/O CM
2 of 6 series · 12 of 20 positions shown, 14 images · non-contrast
Comparison: None.
COMPARISON: None.

Addendum:
EXAM:
OVER-READ INTERPRETATION  CT CHEST

The following report is an over-read performed by radiologist Dr.
Delsa Zapien [REDACTED] on 04/16/2021. This
over-read does not include interpretation of cardiac or coronary
anatomy or pathology. The coronary calcium score/coronary CTA
interpretation by the cardiologist is attached.
CLINICAL DATA: Severe Aortic Stenosis.
Cardiac TAVR CT
TECHNIQUE: A non-contrast, gated CT scan was obtained with axial slices of 3 mm
through the heart for aortic valve calcium scoring. A 110 kV
retrospective, gated, contrast cardiac scan was obtained. Gantry
rotation speed was 250 msecs and collimation was 0.6 mm.
Nitroglycerin was not given. The 3D data set was reconstructed in 5%
intervals of the 0-95% of the R-R cycle. Systolic and diastolic
phases were analyzed on a dedicated workstation using MPR, MIP, and
VRT modes. The patient received 100 cc of contrast.

[Series 6: 0-90% · axial · 0.39mm/px · z∈[-319,-158]mm · 6 of 3750 slices shown, 8 images]
[im 536/3750  vessel]
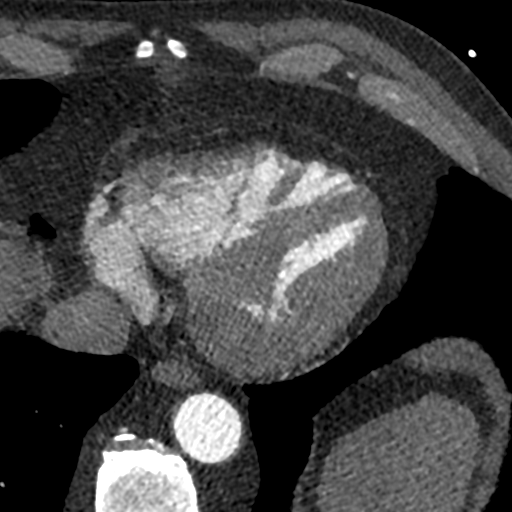
[im 536/3750  lung]
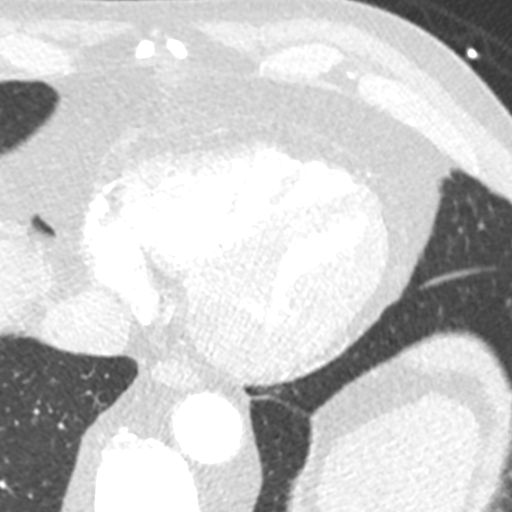
[im 1072/3750  vessel]
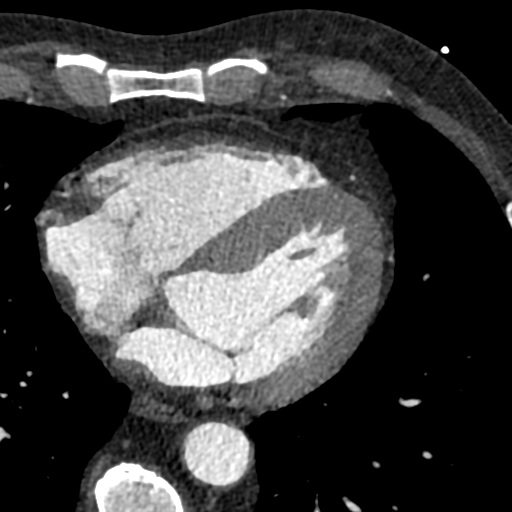
[im 1607/3750  vessel]
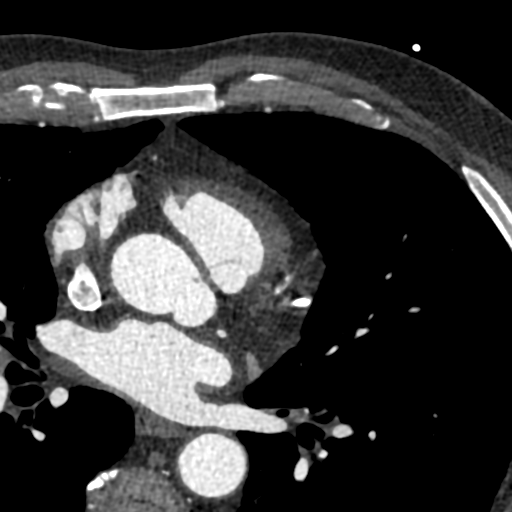
[im 2143/3750  vessel]
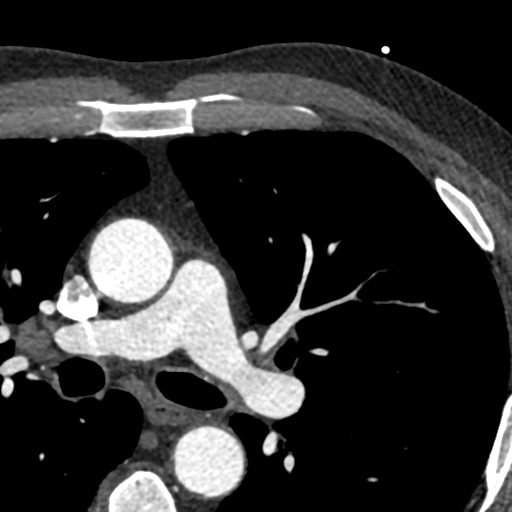
[im 2678/3750  vessel]
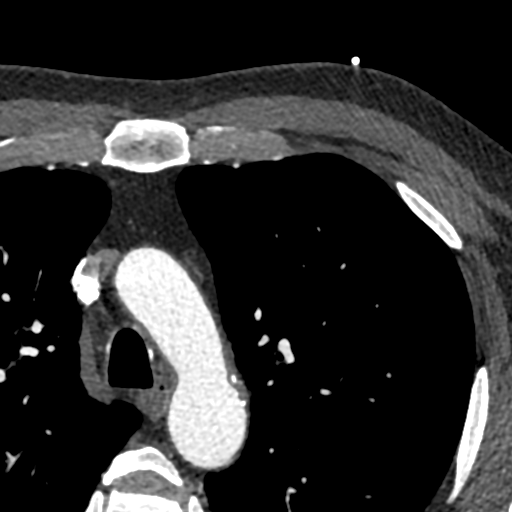
[im 2678/3750  lung]
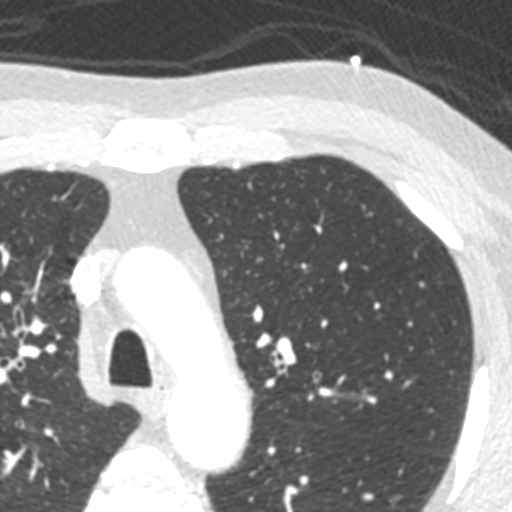
[im 3214/3750  vessel]
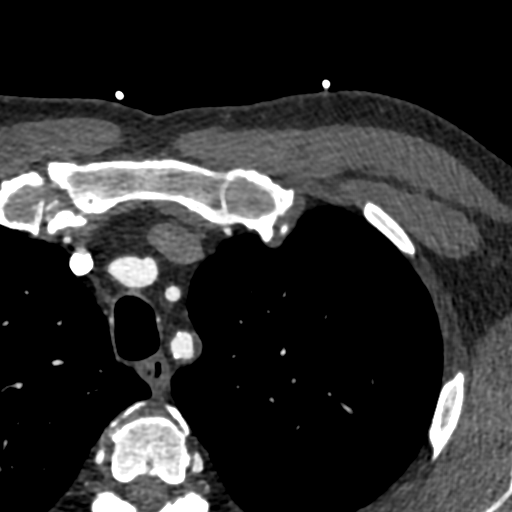

[Series 7: 5-95% · axial · 0.39mm/px · z∈[-319,-158]mm · 6 of 3750 slices shown]
[im 536/3750  vessel]
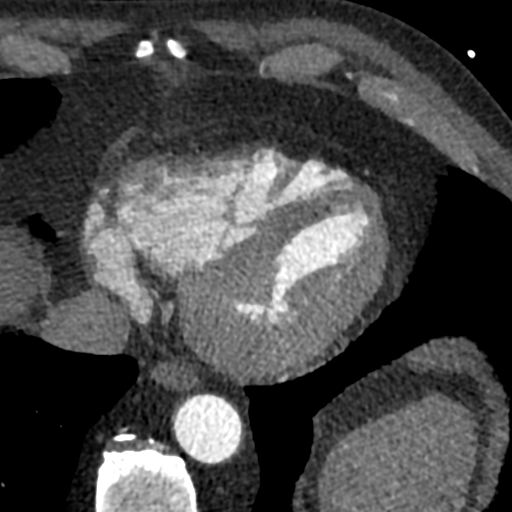
[im 1072/3750  vessel]
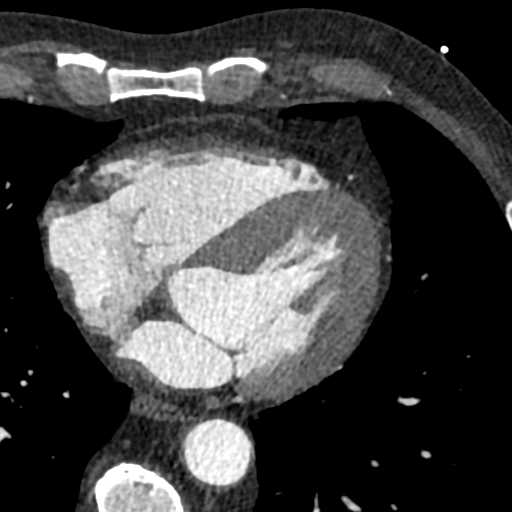
[im 1607/3750  vessel]
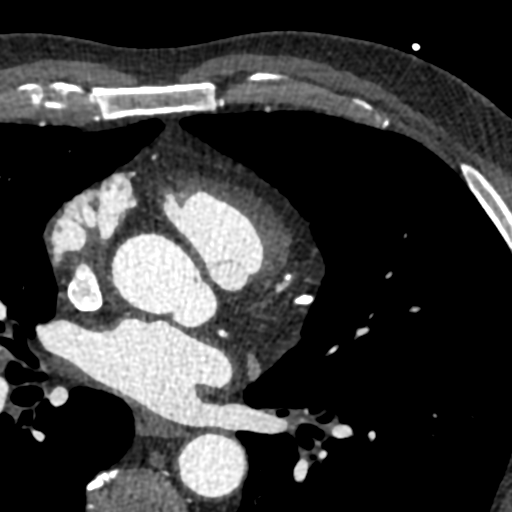
[im 2143/3750  vessel]
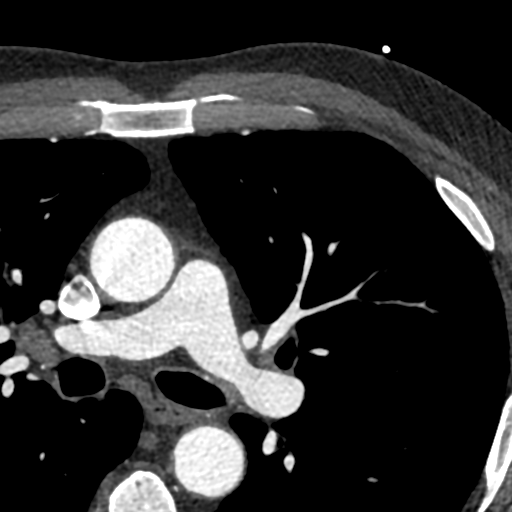
[im 2678/3750  vessel]
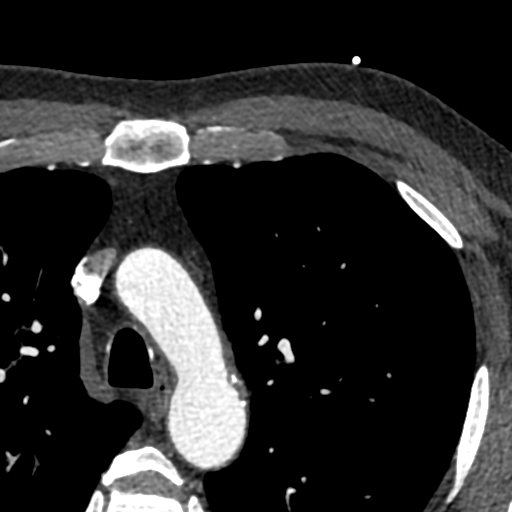
[im 3214/3750  vessel]
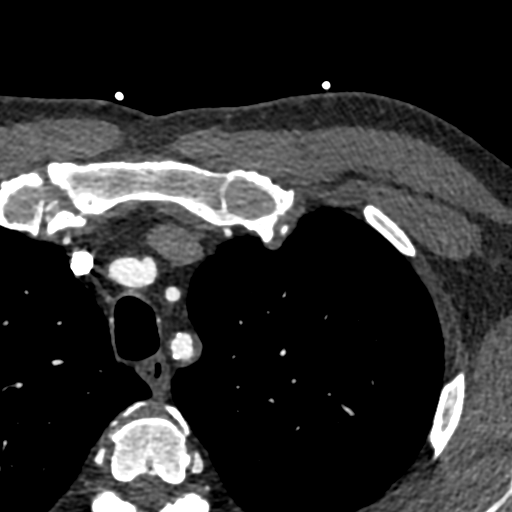

[12 of 20 positions shown; findings below may reference images not displayed]

FINDINGS: Extracardiac findings will be described separately under dictation
for contemporaneously obtained CTA chest, abdomen and pelvis dated
04/16/2021.
IMPRESSION: Please see separate dictation for contemporaneously obtained CTA
chest, abdomen and pelvis 04/16/2021 for full description of
relevant extracardiac findings.
FINDINGS: Image quality: Excellent.

Noise artifact is: Limited.

Valve Morphology: The aortic valve is severely calcified with
restricted leaflet motion in systole. There is severe bulky
calcification on the RCC. There in incomplete leaflet coaptation
noted.

Aortic Valve Calcium score: 4295

Aortic annular dimension:

Phase assessed: 20%

Annular area: 518 mm2

Annular perimeter: 82.1 mm

Max diameter: 29.6 mm

Min diameter: 23.6 mm

Annular and subannular calcification: None.

Optimal coplanar projection: LAO 23 HUNTER 6

Coronary Artery Height above Annulus:

Left Main: 15.9 mm

Right Coronary: 20.1 mm

Sinus of Valsalva Measurements:

Non-coronary: 33 mm

Right-coronary: 32 mm

Left-coronary: 35 mm

Sinus of Valsalva Height:

Non-coronary: 24.0 mm

Right-coronary: 20.1 mm

Left-coronary: 21.6 mm

Sinotubular Junction: 28 mm

Ascending Thoracic Aorta: 33 mm

Coronary Arteries: Normal coronary origin. Right dominance. The
study was performed without use of NTG and is insufficient for
plaque evaluation. Please refer to recent cardiac catheterization
for coronary assessment. 3-vessel calcifications noted.

Cardiac Morphology:

Right Atrium: Right atrial size is within normal limits.

Right Ventricle: The right ventricular cavity is within normal
limits.

Left Atrium: Left atrial size is normal in size with no left atrial
appendage filling defect.

Left Ventricle: The ventricular cavity size is within normal limits.
There are no stigmata of prior infarction. There is no abnormal
filling defect. Normal left ventricular function, EF=66%. No
regional wall motion abnormalities.

Pulmonary arteries: Normal in size without proximal filling defect.

Pulmonary veins: Normal pulmonary venous drainage.

Pericardium: Normal thickness with no significant effusion or
calcium present.

Mitral Valve: The mitral valve is normal structure without
significant calcification.

Extra-cardiac findings: See attached radiology report for
non-cardiac structures.
IMPRESSION: 1. Severely calcified, tricuspid aortic valve with restricted
leaflet motion. Aortic valve calcium score 4295.

2. Annular measurements appropriate for 26 mm S3 (518 mm2).

3. Sufficient coronary to annulus distance.

4. Optimal Fluoroscopic Angle for Delivery: LAO 23 HUNTER 6

*** End of Addendum ***
EXAM:
OVER-READ INTERPRETATION  CT CHEST

The following report is an over-read performed by radiologist Dr.
Delsa Zapien [REDACTED] on 04/16/2021. This
over-read does not include interpretation of cardiac or coronary
anatomy or pathology. The coronary calcium score/coronary CTA
interpretation by the cardiologist is attached.
FINDINGS: Extracardiac findings will be described separately under dictation
for contemporaneously obtained CTA chest, abdomen and pelvis dated
04/16/2021.
IMPRESSION: Please see separate dictation for contemporaneously obtained CTA
chest, abdomen and pelvis 04/16/2021 for full description of
relevant extracardiac findings.

## 2023-04-05 IMAGING — CT CT ANGIO CHEST
2 of 7 series · 16 of 36 positions shown · IV contrast (omnipaque)
Comparison: No priors.

CLINICAL DATA: 70-year-old male with history of severe aortic
stenosis. Preprocedural study prior to potential transcatheter
aortic valve replacement (TAVR) procedure.

EXAM:
CT ANGIOGRAPHY CHEST, ABDOMEN AND PELVIS
TECHNIQUE: Multidetector CT imaging through the chest, abdomen and pelvis was
performed using the standard protocol during bolus administration of
intravenous contrast. Multiplanar reconstructed images and MIPs were
obtained and reviewed to evaluate the vascular anatomy.
CONTRAST:  100mL OMNIPAQUE IOHEXOL 350 MG/ML SOLN

[Series 3: ax thins · axial · 0.59mm/px · z∈[-689,-49]mm · 15 of 721 slices shown]
[im 41/721  lung]
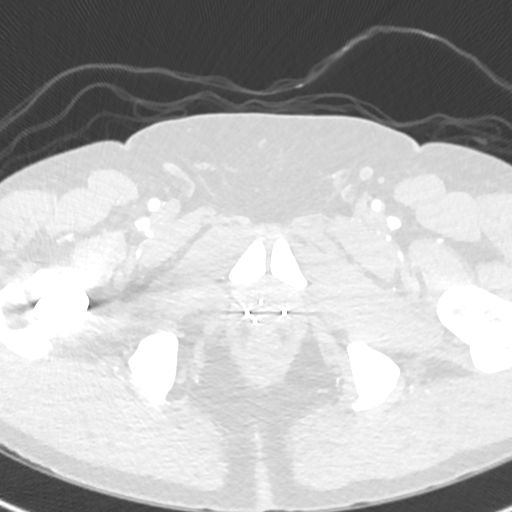
[im 81/721  mediastinal]
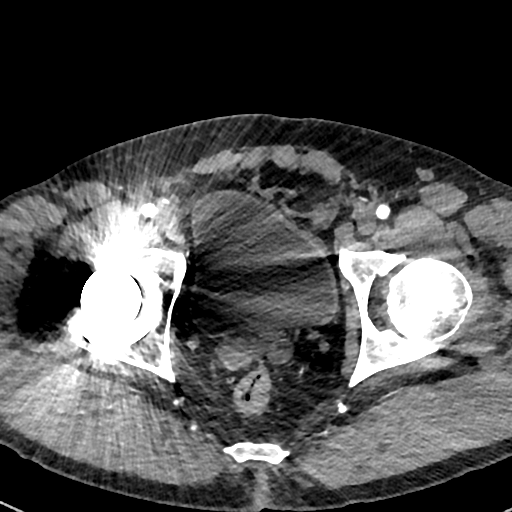
[im 121/721  lung]
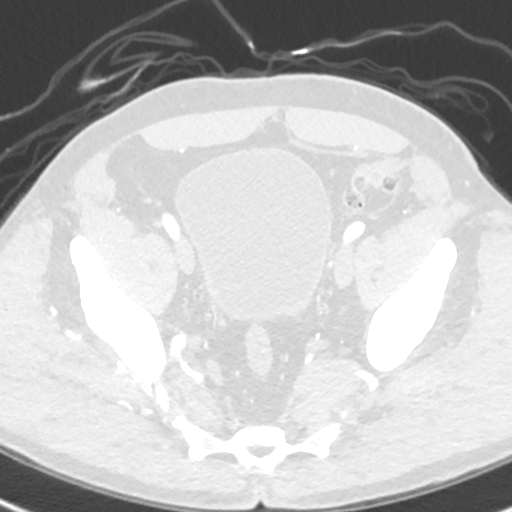
[im 161/721  mediastinal]
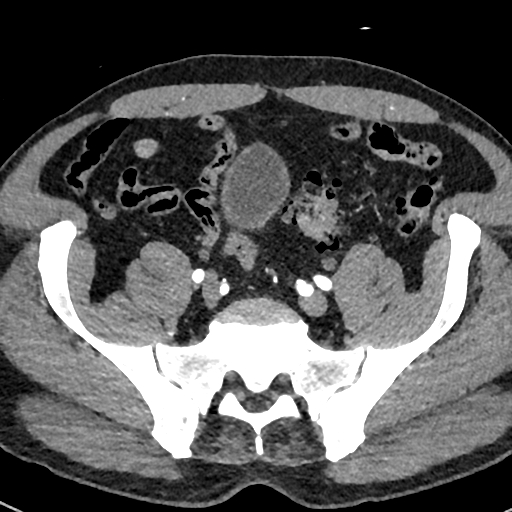
[im 241/721  lung]
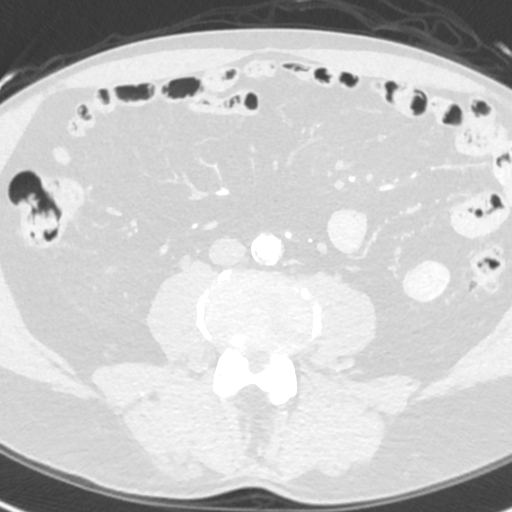
[im 281/721  mediastinal]
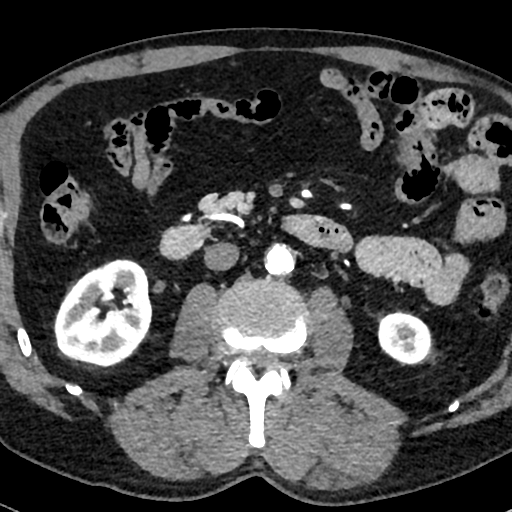
[im 321/721  lung]
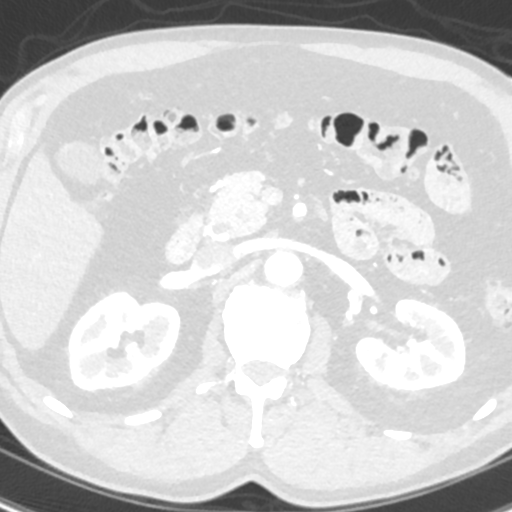
[im 361/721  mediastinal]
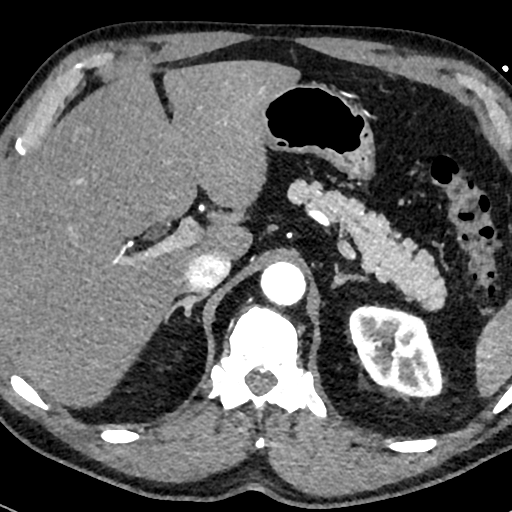
[im 401/721  lung]
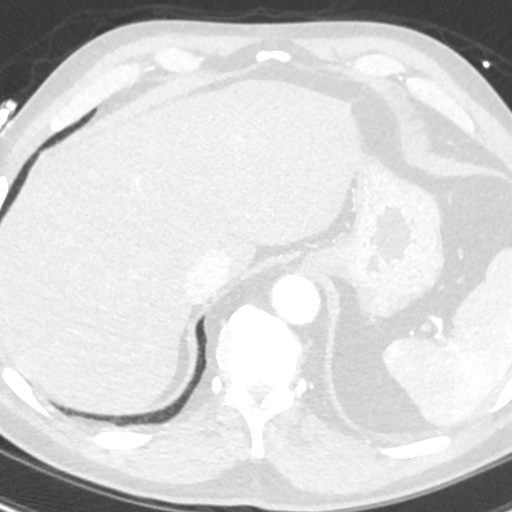
[im 441/721  mediastinal]
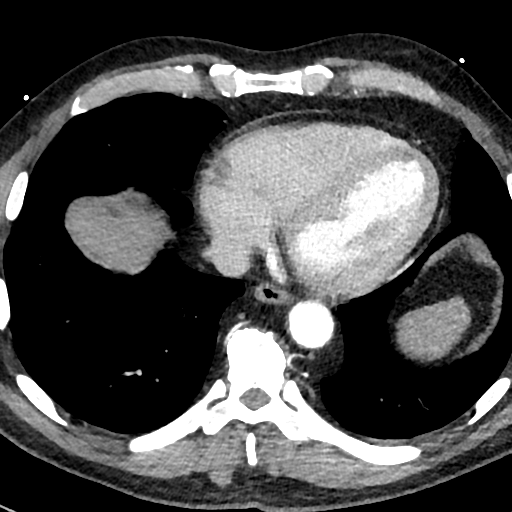
[im 481/721  lung]
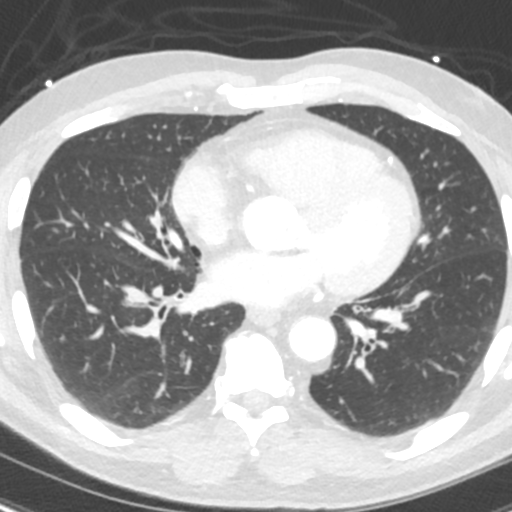
[im 561/721  mediastinal]
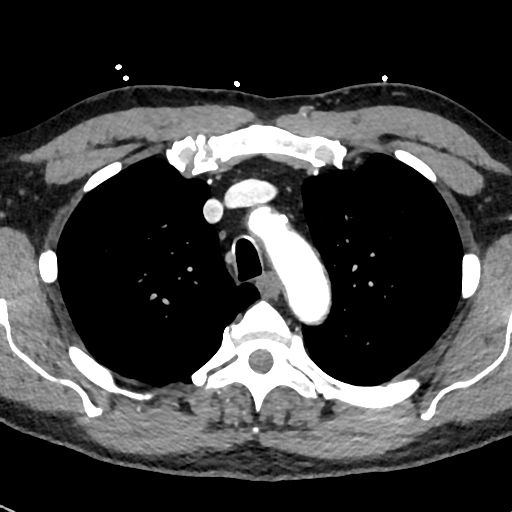
[im 601/721  lung]
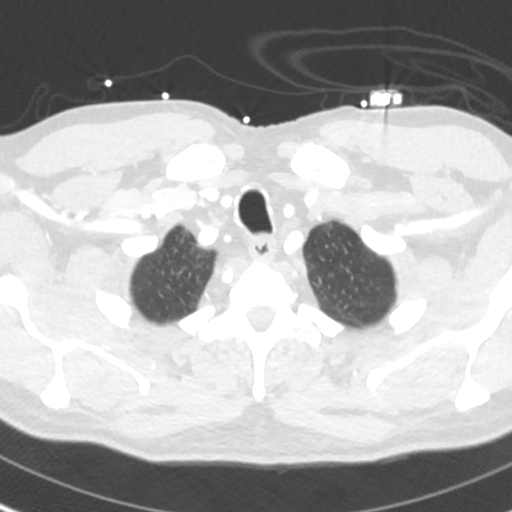
[im 641/721  mediastinal]
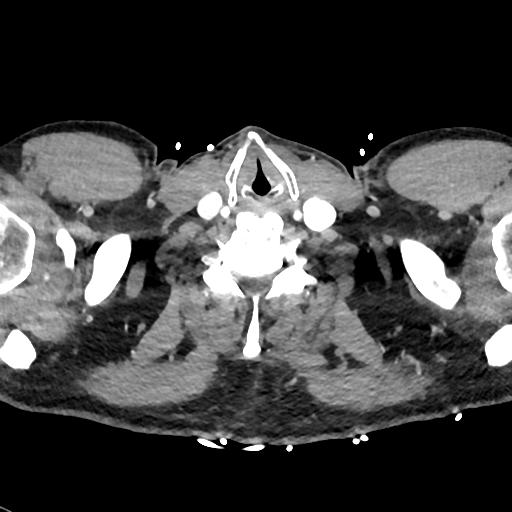
[im 681/721  lung]
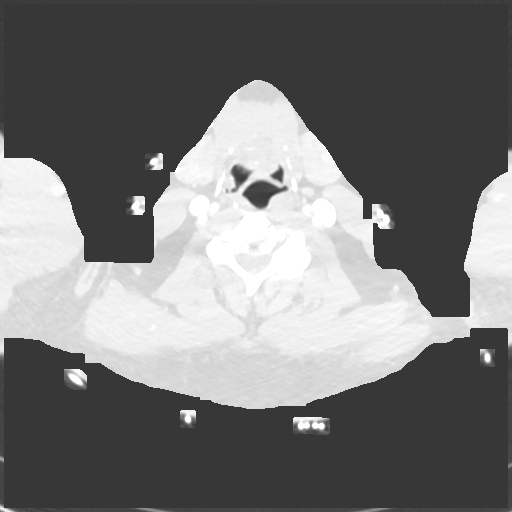

[Series 6: cor · coronal · 0.75mm/px · 1 of 146 slices shown]
[im 73/146  mediastinal]
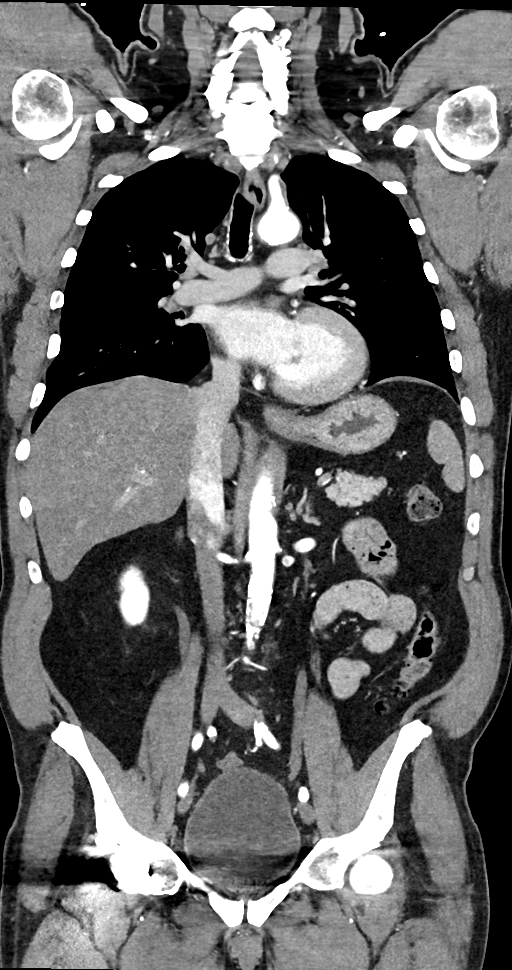

[16 of 36 positions shown; findings below may reference images not displayed]

FINDINGS: CTA CHEST FINDINGS

Cardiovascular: Heart size is normal. There is no significant
pericardial fluid, thickening or pericardial calcification. There is
aortic atherosclerosis, as well as atherosclerosis of the great
vessels of the mediastinum and the coronary arteries, including
calcified atherosclerotic plaque in the left main, left anterior
descending, left circumflex and right coronary arteries. Severe
thickening and calcification of the aortic valve.

Mediastinum/Lymph Nodes: No pathologically enlarged mediastinal or
hilar lymph nodes. Please note that accurate exclusion of hilar
adenopathy is limited on noncontrast CT scans. Esophagus is
unremarkable in appearance. No axillary lymphadenopathy.

Lungs/Pleura: No acute consolidative airspace disease. No pleural
effusions. No suspicious appearing pulmonary nodules or masses are
noted.

Musculoskeletal/Soft Tissues: There are no aggressive appearing
lytic or blastic lesions noted in the visualized portions of the
skeleton.

CTA ABDOMEN AND PELVIS FINDINGS

Hepatobiliary: No suspicious cystic or solid hepatic lesions. No
intra or extrahepatic biliary ductal dilatation. Gallbladder is
normal in appearance.

Pancreas: No pancreatic mass. No pancreatic ductal dilatation. No
pancreatic or peripancreatic fluid collections or inflammatory
changes.

Spleen: Unremarkable.

Adrenals/Urinary Tract: Exophytic 1.8 cm low-attenuation lesion in
the upper pole of the left kidney, compatible with a simple cyst.
Right kidney and bilateral adrenal glands are normal in appearance.
No hydroureteronephrosis. Urinary bladder is normal in appearance.

Stomach/Bowel: Normal appearance of the stomach. No pathologic
dilatation of small bowel or colon. Numerous colonic diverticulae
are noted, particularly in the descending colon and sigmoid colon,
without surrounding inflammatory changes to suggest an acute
diverticulitis at this time. Normal appendix.

Vascular/Lymphatic: Vascular findings and measurements pertinent to
potential TAVR procedure, as detailed below. No aneurysm or
dissection noted in the abdominal or pelvic vasculature. No
lymphadenopathy noted in the abdomen or pelvis.

Reproductive: Brachytherapy implants within the prostate gland.
Seminal vesicles are unremarkable in appearance.

Other: No significant volume of ascites.  No pneumoperitoneum.

Musculoskeletal: Status post right hip arthroplasty. Status post
laminectomy at L4-L5. There are no aggressive appearing lytic or
blastic lesions noted in the visualized portions of the skeleton.

VASCULAR MEASUREMENTS PERTINENT TO TAVR:

AORTA:

Minimal Aortic Niameter-DD x 11 mm

Severity of Aortic Calcification-severe

RIGHT PELVIS:

Right Common Iliac Artery -

Minimal 1iameter-G.V x 7.2 mm

Tortuosity-mild

Calcification-moderate

Right External Iliac Artery -

Minimal 7iameter-5.F x 6.1 mm

Tortuosity-moderate

Calcification-mild

Right Common Femoral Artery -

Minimal 9iameter-L.2 x 7.7 mm

Tortuosity-mild

Calcification-mild-to-moderate

LEFT PELVIS:

Left Common Iliac Artery -

Minimal 7iameter-5.F x 5.1 mm

Tortuosity-mild

Calcification-moderate

Left External Iliac Artery -

Minimal Ciameter-F.S x 5.2 mm

Tortuosity-moderate

Calcification-mild

Left Common Femoral Artery -

Minimal Hiameter-E.Z x 6.8 mm

Tortuosity-mild

Calcification-mild

Review of the MIP images confirms the above findings.
IMPRESSION: 1. Vascular findings and measurements pertinent to potential TAVR
procedure, as detailed above.
2. Severe thickening and calcification of the aortic valve,
compatible with reported clinical history of severe aortic stenosis.
3. Aortic atherosclerosis, in addition to left main and 3 vessel
coronary artery disease.
4. Severe colonic diverticulosis without evidence of acute
diverticulitis at this time
5. Additional incidental findings, as above.

## 2023-08-25 DIAGNOSIS — R3912 Poor urinary stream: Secondary | ICD-10-CM | POA: Diagnosis not present

## 2023-08-25 DIAGNOSIS — R3121 Asymptomatic microscopic hematuria: Secondary | ICD-10-CM | POA: Diagnosis not present

## 2023-09-25 ENCOUNTER — Other Ambulatory Visit: Payer: Self-pay | Admitting: Family Medicine

## 2023-09-25 DIAGNOSIS — I77811 Abdominal aortic ectasia: Secondary | ICD-10-CM | POA: Diagnosis not present

## 2023-09-25 DIAGNOSIS — J439 Emphysema, unspecified: Secondary | ICD-10-CM | POA: Diagnosis not present

## 2023-09-25 DIAGNOSIS — E039 Hypothyroidism, unspecified: Secondary | ICD-10-CM | POA: Diagnosis not present

## 2023-09-25 DIAGNOSIS — I10 Essential (primary) hypertension: Secondary | ICD-10-CM | POA: Diagnosis not present

## 2023-09-25 DIAGNOSIS — I700 Atherosclerosis of aorta: Secondary | ICD-10-CM | POA: Diagnosis not present

## 2023-09-25 DIAGNOSIS — I77819 Aortic ectasia, unspecified site: Secondary | ICD-10-CM

## 2023-09-25 DIAGNOSIS — Z23 Encounter for immunization: Secondary | ICD-10-CM | POA: Diagnosis not present

## 2023-09-25 DIAGNOSIS — Z0000 Encounter for general adult medical examination without abnormal findings: Secondary | ICD-10-CM | POA: Diagnosis not present

## 2023-09-25 DIAGNOSIS — E7800 Pure hypercholesterolemia, unspecified: Secondary | ICD-10-CM | POA: Diagnosis not present

## 2023-11-04 NOTE — Progress Notes (Unsigned)
Cardiology Office Note:  .   Date:  11/05/2023  ID:  Oscar Bailey, DOB 02/08/1951, MRN [id number] PCP: Wilfrid Lund, PA  [facility] Providers Cardiologist:  Kristeen Miss, MD Cardiology APP:  Beatrice Lecher, PA-C    Patient Profile: .      PMH Coronary artery disease LHC 02/2010 [facility] mLAD 30, proximal D1 80, RCA 50>>PCI 2.25 x 20 mm taxus DES to D1 GXT 12/25/2016 [facility] [place]) Ex 6'15", 88% PMHR, 7.4 METs, no chest pain or ECG changes R/LHC 04/01/2021 Proximal RCA 20, proximal LAD to mid LAD 20, distal LAD 30, first diagonal 10 Patent diagonal stent Severe aortic stenosis Severe aortic stenosis s/p TAVR TAVR with 26 mm Edwards Sapien 3 Ultra via TF approach 07/16/2021 TTE 06/13/2022: 1 year follow-up EF 65%, normally functioning TAVR with mean gradient of 9 mmHg and trivial PVL Hypertension Hyperlipidemia Prostate cancer Arthritis Tobacco abuse  History of MI in 2011 treated with DES to D1 in [facility].  Poet in 2018 was low risk.  Mild abdominal aortic ectasia measuring 2.5 cm on ultrasound 07/2018 with recommendation to repeat in 5 years.  LDL cholesterol was 96 October 2020 and atorvastatin was increased to 80 mg daily.  He had worsening aortic stenosis that progressed to severe and he underwent TAVR 07/2021.    Seen by Dr. Elease Hashimoto 04/25/2022 for general cardiology follow-up.  He was feeling well at that time.  No changes were made to treatment regimen and 1 year follow-up was recommended.  Last cardiology clinic visit was 06/13/2022 with Carlean Jews, PA for his 1 year follow-up of TAVR.  Echocardiogram 06/13/2022 reveals normal LVEF, normal functioning TAVR with mean gradient of 9 mmHg and trivial PVL.  He was cleared by our preop team for colonoscopy 03/2023.  He reported he was active going camping and walking at least 3 days a week, also doing house and yard work.       History of Present Illness: .   Oscar Bailey is a very pleasant 73 y.o. male  who is here for overdue follow-up of CAD. He reports feeling like a '[age] old man' with 'regular aches and pains' but denies any specific symptoms such as chest pain, shortness of breath, lightheadedness, edema, presyncope or syncope.  He remains active with regular yard work as well as occasionally exercising outside.  He is getting ready to paint his house.  He also reports a healthy diet, including regular consumption of fruits and vegetables, and has reduced his alcohol intake. He does not monitor his BP at home but expresses interest in purchasing a home blood pressure monitor. He also mentions a recent cold, which took about ten days to resolve.  He has had no concerning symptoms and no interval hospitalizations.  Reports PCP wanted him to get an echocardiogram but he is unsure why.  We discussed routine monitoring of his TAVR valve.   Discussed the use of AI scribe software for clinical note transcription with the patient, who gave verbal consent to proceed.   ROS: See HPI       Studies Reviewed: Marland Kitchen   EKG Interpretation Date/Time:  Thursday November 05 2023 11:04:26 EST Ventricular Rate:  60 PR Interval:  180 QRS Duration:  92 QT Interval:  370 QTC Calculation: 370 R Axis:   8  Text Interpretation: Normal sinus rhythm Septal infarct (cited on or before 16-Jul-2021) When compared with ECG of 17-Jul-2021 05:37, No significant change was found  Confirmed by Eligha Bridegroom [phone]) on 11/05/2023 11:10:49 AM    Risk Assessment/Calculations:             Physical Exam:   VS:  BP 124/82   Pulse 60   Ht 5\' 7"  (1.702 m)   Wt 210 lb 3.2 oz (95.3 kg)   SpO2 95%   BMI 32.92 kg/m    Wt Readings from Last 3 Encounters:  11/05/23 210 lb 3.2 oz (95.3 kg)  06/13/22 207 lb 12.8 oz (94.3 kg)  04/25/22 212 lb 6.4 oz (96.3 kg)    GEN: Well nourished, well developed in no acute distress NECK: No JVD; No carotid bruits CARDIAC: RRR, no murmurs, rubs, gallops RESPIRATORY:  Clear to  auscultation without rales, wheezing or rhonchi  ABDOMEN: Soft, non-tender, non-distended EXTREMITIES:  No edema; No deformity     ASSESSMENT AND PLAN: .    CAD without angina: History of PCI/DES to diagonal 1 in [place] in [place]. Right and left heart cath prior to TAVR revealed patent stent, mild nonobstructive plaque in LAD and RC. He remains active with routine housework as well as calisthenics and stretching. EKG today is unremarkable. He denies chest pain, dyspnea, or other symptoms concerning for angina.  No indication for further ischemic evaluation at this time.  No bleeding concerns.  LDL well controlled on labs completed 09/2023. Continue aspirin, ezetimibe, lisinopril, metoprolol, rosuvastatin.  Severe AS s/p TAVR: S/p TAVR 07/16/2021.  1 year post TAVR echo 06/13/2022 with EF 65%, normally functioning TAVR with a mean gradient of 9 mmHg and trivial PVL. He is asymptomatic. Advised him of potential symptoms of worsening valve function to report. Reports he does not visit the dentist routinely. Advised importance of SBE prophylaxis.  We will refill his amoxicillin prescription so that he has it if needed.  Hypertension: BP is well controlled.  He has not been monitoring at home but plans to get a cuff.  Stable renal function on labs completed 09/25/2023.  No medication changes today.  Hyperlipidemia LDL goal < 70: Lipid panel completed 09/25/2023 with total cholesterol 135, HDL 54, LDL 50, and triglycerides 956. Lipids are well controlled.  Continue ezetimibe and rosuvastatin.       Disposition: 1 year with  new MD (transfer from Dr. Elease Hashimoto)  Signed, Eligha Bridegroom, NP-C

## 2023-11-05 ENCOUNTER — Encounter: Payer: Self-pay | Admitting: Nurse Practitioner

## 2023-11-05 ENCOUNTER — Ambulatory Visit: Payer: Medicare Other | Attending: Nurse Practitioner | Admitting: Nurse Practitioner

## 2023-11-05 VITALS — BP 124/82 | HR 60 | Ht 67.0 in | Wt 210.2 lb

## 2023-11-05 DIAGNOSIS — I10 Essential (primary) hypertension: Secondary | ICD-10-CM

## 2023-11-05 DIAGNOSIS — E782 Mixed hyperlipidemia: Secondary | ICD-10-CM

## 2023-11-05 DIAGNOSIS — I350 Nonrheumatic aortic (valve) stenosis: Secondary | ICD-10-CM

## 2023-11-05 DIAGNOSIS — I2510 Atherosclerotic heart disease of native coronary artery without angina pectoris: Secondary | ICD-10-CM

## 2023-11-05 DIAGNOSIS — Z952 Presence of prosthetic heart valve: Secondary | ICD-10-CM

## 2023-11-05 MED ORDER — AMOXICILLIN 500 MG PO TABS: ORAL_TABLET | ORAL | 3 refills | Status: AC

## 2023-11-05 NOTE — Patient Instructions (Signed)
Medication Instructions:   Your physician recommends that you continue on your current medications as directed. Please refer to the Current Medication list given to you today.   *If you need a refill on your cardiac medications before your next appointment, please call your pharmacy*   Lab Work:  None ordered.  If you have labs (blood work) drawn today and your tests are completely normal, you will receive your results only by: MyChart Message (if you have MyChart) OR A paper copy in the mail If you have any lab test that is abnormal or we need to change your treatment, we will call you to review the results.   Testing/Procedures:  None ordered.   Follow-Up: At [facility], you and your health needs are our priority.  As part of our continuing mission to provide you with exceptional heart care, we have created designated Provider Care Teams.  These Care Teams include your primary Cardiologist (physician) and Advanced Practice Providers (APPs -  Physician Assistants and Nurse Practitioners) who all work together to provide you with the care you need, when you need it.  We recommend signing up for the patient portal called "MyChart".  Sign up information is provided on this After Visit Summary.  MyChart is used to connect with patients for Virtual Visits (Telemedicine).  Patients are able to view lab/test results, encounter notes, upcoming appointments, etc.  Non-urgent messages can be sent to your provider as well.   To learn more about what you can do with MyChart, go to [email or web address].    Your next appointment:   1 year(s)  Provider:   Kristeen Miss, MD     Other Instructions  Your physician wants you to follow-up in: 1 year.  You will receive a reminder letter in the mail two months in advance. If you don't receive a letter, please call our office to schedule the follow-up appointment.     1st Floor: - Lobby - Registration  - Pharmacy  - Lab -  Cafe  2nd Floor: - PV Lab - Diagnostic Testing (echo, CT, nuclear med)  3rd Floor: - Vacant  4th Floor: - TCTS (cardiothoracic surgery) - AFib Clinic - Structural Heart Clinic - Vascular Surgery  - Vascular Ultrasound  5th Floor: - HeartCare Cardiology (general and EP) - Clinical Pharmacy for coumadin, hypertension, lipid, weight-loss medications, and med management appointments    Valet parking services will be available as well.

## 2024-02-04 ENCOUNTER — Other Ambulatory Visit: Payer: Self-pay | Admitting: Acute Care

## 2024-02-04 DIAGNOSIS — Z87891 Personal history of nicotine dependence: Secondary | ICD-10-CM

## 2024-02-04 DIAGNOSIS — F17210 Nicotine dependence, cigarettes, uncomplicated: Secondary | ICD-10-CM

## 2024-03-21 ENCOUNTER — Ambulatory Visit
Admission: RE | Admit: 2024-03-21 | Discharge: 2024-03-21 | Disposition: A | Discharge status: Home / Self Care | Source: Ambulatory Visit | Attending: Family Medicine | Admitting: Family Medicine

## 2024-03-21 DIAGNOSIS — Z87891 Personal history of nicotine dependence: Secondary | ICD-10-CM

## 2024-03-21 DIAGNOSIS — Z122 Encounter for screening for malignant neoplasm of respiratory organs: Secondary | ICD-10-CM | POA: Diagnosis not present

## 2024-03-21 DIAGNOSIS — F17210 Nicotine dependence, cigarettes, uncomplicated: Secondary | ICD-10-CM | POA: Diagnosis not present

## 2024-03-25 DIAGNOSIS — I2510 Atherosclerotic heart disease of native coronary artery without angina pectoris: Secondary | ICD-10-CM | POA: Diagnosis not present

## 2024-03-25 DIAGNOSIS — I25119 Atherosclerotic heart disease of native coronary artery with unspecified angina pectoris: Secondary | ICD-10-CM | POA: Diagnosis not present

## 2024-03-25 DIAGNOSIS — E039 Hypothyroidism, unspecified: Secondary | ICD-10-CM | POA: Diagnosis not present

## 2024-03-25 DIAGNOSIS — Z720 Tobacco use: Secondary | ICD-10-CM | POA: Diagnosis not present

## 2024-03-25 DIAGNOSIS — J439 Emphysema, unspecified: Secondary | ICD-10-CM | POA: Diagnosis not present

## 2024-03-25 DIAGNOSIS — I77819 Aortic ectasia, unspecified site: Secondary | ICD-10-CM | POA: Diagnosis not present

## 2024-03-25 DIAGNOSIS — I700 Atherosclerosis of aorta: Secondary | ICD-10-CM | POA: Diagnosis not present

## 2024-03-25 DIAGNOSIS — I10 Essential (primary) hypertension: Secondary | ICD-10-CM | POA: Diagnosis not present

## 2024-03-25 DIAGNOSIS — E7800 Pure hypercholesterolemia, unspecified: Secondary | ICD-10-CM | POA: Diagnosis not present

## 2024-04-04 ENCOUNTER — Other Ambulatory Visit: Payer: Self-pay

## 2024-04-04 DIAGNOSIS — Z122 Encounter for screening for malignant neoplasm of respiratory organs: Secondary | ICD-10-CM

## 2024-04-04 DIAGNOSIS — Z87891 Personal history of nicotine dependence: Secondary | ICD-10-CM

## 2024-04-04 DIAGNOSIS — F17210 Nicotine dependence, cigarettes, uncomplicated: Secondary | ICD-10-CM

## 2024-11-17 ENCOUNTER — Telehealth: Payer: Self-pay | Admitting: Nurse Practitioner

## 2024-11-17 NOTE — Telephone Encounter (Signed)
 Luci from [facility] is calling requesting for patient to be cleared to start taking stimulants.     (Preferred) Email- [email or web address] Fax # [phone]
